# Patient Record
Sex: Male | Born: 1955 | Hispanic: Yes | Marital: Married | Smoking: Never smoker
Health system: Southern US, Community
[De-identification: ages and names within clinical notes are randomized; demographics above are authoritative.]

## PROBLEM LIST (undated history)

## (undated) HISTORY — PX: NO PAST SURGERIES: SHX2092

---

## 2017-11-10 ENCOUNTER — Ambulatory Visit: Payer: Self-pay | Admitting: Physician Assistant

## 2017-11-11 ENCOUNTER — Encounter: Payer: Worker's Compensation | Attending: Physician Assistant | Admitting: Physician Assistant

## 2017-11-11 DIAGNOSIS — T24732A Corrosion of third degree of left lower leg, initial encounter: Secondary | ICD-10-CM | POA: Diagnosis present

## 2017-11-11 DIAGNOSIS — T543X1A Toxic effect of corrosive alkalis and alkali-like substances, accidental (unintentional), initial encounter: Secondary | ICD-10-CM | POA: Insufficient documentation

## 2017-11-14 NOTE — Progress Notes (Signed)
ABDINASIR, SPADAFORE ([id number]) Visit Report for 11/11/2017 Abuse/Suicide Risk Screen Details Patient Name: [name], [name] Date of Service: 11/11/2017 8:00 AM Medical Record Number: [id number] Patient Account Number: [id number] Date of Birth/Sex: 02/03/1956 (62 y.o. M) Treating RN: Montey Hora Primary Care Morgyn Marut: PATIENT, NO Other Clinician: Referring Harriette Tovey: Sarajane Jews Treating Justine Cossin/Extender: STONE III, HOYT Weeks in Treatment: 0 Abuse/Suicide Risk Screen Items Answer ABUSE/SUICIDE RISK SCREEN: Has anyone close to you tried to hurt or harm you recentlyo No Do you feel uncomfortable with anyone in your familyo No Has anyone forced you do things that you didnot want to doo No Do you have any thoughts of harming yourselfo No Patient displays signs or symptoms of abuse and/or neglect. No Electronic Signature(s) Signed: 11/11/2017 3:57:25 PM By: Montey Hora Entered By: Montey Hora on 11/11/2017 01:60:10 [place]O POLICARPIO, Sol Blazing ([id number]) -------------------------------------------------------------------------------- Activities of Daily Living Details Patient Name: [name] Date of Service: 11/11/2017 8:00 AM Medical Record Number: [id number] Patient Account Number: [id number] Date of Birth/Sex: Jul 16, 1955 (62 y.o. M) Treating RN: Montey Hora Primary Care Cheryln Balcom: PATIENT, NO Other Clinician: Referring Raphaella Larkin: Sarajane Jews Treating Kepler Mccabe/Extender: STONE III, HOYT Weeks in Treatment: 0 Activities of Daily Living Items Answer Activities of Daily Living (Please select one for each item) Drive Automobile Completely Able Take Medications Completely Able Use Telephone Completely Able Care for Appearance Completely Able Use Toilet Completely Able Bath / Shower Completely Able Dress Self Completely Able Feed Self Completely Able Walk Completely Able Get In / Out Bed Completely Able Housework Completely  Able Prepare Meals Completely [place] for Self Completely Able Electronic Signature(s) Signed: 11/11/2017 3:57:25 PM By: Montey Hora Entered By: Montey Hora on 11/11/2017 23:76:28 CAPET[place]O Marlane Mingle ([id number]) -------------------------------------------------------------------------------- Education Assessment Details Patient Name: [name] Date of Service: 11/11/2017 8:00 AM Medical Record Number: [id number] Patient Account Number: [id number] Date of Birth/Sex: 1956-02-24 (62 y.o. M) Treating RN: Montey Hora Primary Care Joselinne Lawal: PATIENT, NO Other Clinician: Referring Hisao Doo: Sarajane Jews Treating Spring San/Extender: Melburn Hake, HOYT Weeks in Treatment: 0 Primary Learner Assessed: Patient Learning Preferences/Education Level/Primary Language Learning Preference: Explanation, Demonstration Highest Education Level: High School Preferred Language: Spanish Cognitive Barrier Assessment/Beliefs Language Barrier: Psychologist, occupational Needed: Yes Hospital Employed Language Interpreter Memory Deficit: No Emotional Barrier: No Cultural/Religious Beliefs Affecting Medical Care: No Physical Barrier Assessment Impaired Vision: No Impaired Hearing: No Decreased Hand dexterity: No Knowledge/Comprehension Assessment Knowledge Level: Medium Comprehension Level: Medium Ability to understand written Medium instructions: Ability to understand verbal Medium instructions: Motivation Assessment Anxiety Level: Calm Cooperation: Cooperative Education Importance: Acknowledges Need Interest in Health Problems: Asks Questions Perception: Coherent Willingness to Engage in Self- Medium Management Activities: Readiness to Engage in Self- Medium Management Activities: Electronic Signature(s) Signed: 11/11/2017 3:57:25 PM By: Montey Hora Entered By: Montey Hora on 11/11/2017 48:54:62 CAPET[place]O POLICARPIO, Sol Blazing  ([id number]) -------------------------------------------------------------------------------- Fall Risk Assessment Details Patient Name: [name] Date of Service: 11/11/2017 8:00 AM Medical Record Number: [id number] Patient Account Number: [id number] Date of Birth/Sex: 1955-11-23 (62 y.o. M) Treating RN: Montey Hora Primary Care Kailen Name: PATIENT, NO Other Clinician: Referring Andrey Hoobler: Sarajane Jews Treating Lyncoln Maskell/Extender: STONE III, HOYT Weeks in Treatment: 0 Fall Risk Assessment Items Have you had 2 or more falls in the last 12 monthso 0 No Have you had any fall that resulted in injury in the last 12 monthso 0 No FALL RISK ASSESSMENT: History of falling - immediate or within 3 months 0 No Secondary diagnosis 0 No Ambulatory aid None/bed rest/wheelchair/nurse 0 Yes Crutches/cane/walker  0 No Furniture 0 No IV Access/Saline Lock 0 No Gait/Training Normal/bed rest/immobile 0 Yes Weak 0 No Impaired 0 No Mental Status Oriented to own ability 0 Yes Electronic Signature(s) Signed: 11/11/2017 3:57:25 PM By: Montey Hora Entered By: Montey Hora on 11/11/2017 47:09:62 CAPET[place]O POLICARPIO, Sol Blazing ([id number]) -------------------------------------------------------------------------------- Foot Assessment Details Patient Name: [name] Date of Service: 11/11/2017 8:00 AM Medical Record Number: [id number] Patient Account Number: [id number] Date of Birth/Sex: 11-Oct-1955 (62 y.o. M) Treating RN: Montey Hora Primary Care Annalisse Minkoff: PATIENT, NO Other Clinician: Referring Simren Popson: Sarajane Jews Treating Nkenge Sonntag/Extender: STONE III, HOYT Weeks in Treatment: 0 Foot Assessment Items Site Locations + = Sensation present, - = Sensation absent, C = Callus, U = Ulcer R = Redness, W = Warmth, M = Maceration, PU = Pre-ulcerative lesion F = Fissure, S = Swelling, D = Dryness Assessment Right: Left: Other Deformity: No No Prior Foot Ulcer: No  No Prior Amputation: No No Charcot Joint: No No Ambulatory Status: Ambulatory Without Help Gait: Steady Electronic Signature(s) Signed: 11/11/2017 3:57:25 PM By: Montey Hora Entered By: Montey Hora on 11/11/2017 56:81:27 CAPET[place]O POLICARPIO, Sol Blazing ([id number]) -------------------------------------------------------------------------------- Nutrition Risk Assessment Details Patient Name: [name] Date of Service: 11/11/2017 8:00 AM Medical Record Number: [id number] Patient Account Number: [id number] Date of Birth/Sex: [birth year] (62 y.o. M) Treating RN: Montey Hora Primary Care Gerline Ratto: PATIENT, NO Other Clinician: Referring Kris No: Sarajane Jews Treating Malakhai Beitler/Extender: STONE III, HOYT Weeks in Treatment: 0 Height (in): Weight (lbs): Body Mass Index (BMI): Nutrition Risk Assessment Items NUTRITION RISK SCREEN: I have an illness or condition that made me change the kind and/or amount of 0 No food I eat I eat fewer than two meals per day 0 No I eat few fruits and vegetables, or milk products 0 No I have three or more drinks of beer, liquor or wine almost every day 0 No I have tooth or mouth problems that make it hard for me to eat 0 No I don't always have enough money to buy the food I need 0 No I eat alone most of the time 0 No I take three or more different prescribed or over-the-counter drugs a day 0 No Without wanting to, I have lost or gained 10 pounds in the last six months 0 No I am not always physically able to shop, cook and/or feed myself 0 No Nutrition Protocols Good Risk Protocol 0 No interventions needed Moderate Risk Protocol Electronic Signature(s) Signed: 11/11/2017 3:57:25 PM By: Montey Hora Entered By: Montey Hora on 11/11/2017 08:18:06

## 2017-11-15 NOTE — Progress Notes (Signed)
SEDERICK, Leonard ([id number]) Visit Report for 11/11/2017 Allergy List Details Patient Name: [name], [name] Date of Service: 11/11/2017 8:00 AM Medical Record Number: [id number] Patient Account Number: [id number] Date of Birth/Sex: [birth year] (62 y.o. M) Treating RN: Montey Hora Primary Care Meesha Sek: PATIENT, NO Other Clinician: Referring Satoru Milich: Sarajane Jews Treating Sebastiana Wuest/Extender: STONE III, HOYT Weeks in Treatment: 0 Allergies Active Allergies No Known Allergies Allergy Notes Electronic Signature(s) Signed: 11/11/2017 3:57:25 PM By: Montey Hora Entered By: Montey Hora on 11/11/2017 75:64:33 CAPETILLO POLICARPIO, Sol Blazing ([id number]) -------------------------------------------------------------------------------- Arrival Information Details Patient Name: [name] Date of Service: 11/11/2017 8:00 AM Medical Record Number: [id number] Patient Account Number: [id number] Date of Birth/Sex: Nov 21, 1955 (61 y.o. M) Treating RN: Montey Hora Primary Care Kirston Luty: PATIENT, NO Other Clinician: Referring Aundrey Elahi: Sarajane Jews Treating Bruchy Mikel/Extender: Melburn Hake, HOYT Weeks in Treatment: 0 Visit Information Patient Arrived: Ambulatory Arrival Time: 08:15 Accompanied By: translator Transfer Assistance: None Patient Identification Verified: Yes Secondary Verification Process Completed: Yes Electronic Signature(s) Signed: 11/11/2017 3:57:25 PM By: Montey Hora Entered By: Montey Hora on 11/11/2017 09:32:35 Denhoff, Sol Blazing ([id number]) -------------------------------------------------------------------------------- Clinic Level of Care Assessment Details Patient Name: [name] Date of Service: 11/11/2017 8:00 AM Medical Record Number: [id number] Patient Account Number: [id number] Date of Birth/Sex: Aug 31, 1955 (61 y.o. M) Treating RN: Ahmed Prima Primary Care Mariellen Blaney: PATIENT, NO Other  Clinician: Referring Natayla Cadenhead: Sarajane Jews Treating [place] Monds/Extender: STONE III, HOYT Weeks in Treatment: 0 Clinic Level of Care Assessment Items TOOL 2 Quantity Score X - Use when only an EandM is performed on the INITIAL visit 1 0 ASSESSMENTS - Nursing Assessment / Reassessment X - General Physical Exam (combine w/ comprehensive assessment (listed just below) when 1 20 performed on new pt. evals) X- 1 25 Comprehensive Assessment (HX, ROS, Risk Assessments, Wounds Hx, etc.) ASSESSMENTS - Wound and Skin Assessment / Reassessment X - Simple Wound Assessment / Reassessment - one wound 1 5 []  - 0 Complex Wound Assessment / Reassessment - multiple wounds []  - 0 Dermatologic / Skin Assessment (not related to wound area) ASSESSMENTS - Ostomy and/or Continence Assessment and Care []  - Incontinence Assessment and Management 0 []  - 0 Ostomy Care Assessment and Management (repouching, etc.) PROCESS - Coordination of Care X - Simple Patient / Family Education for ongoing care 1 15 []  - 0 Complex (extensive) Patient / Family Education for ongoing care []  - 0 Staff obtains Programmer, systems, Records, Test Results / Process Orders []  - 0 Staff telephones HHA, Nursing Homes / Clarify orders / etc []  - 0 Routine Transfer to another Facility (non-emergent condition) []  - 0 Routine Hospital Admission (non-emergent condition) []  - 0 New Admissions / Biomedical engineer / Ordering NPWT, Apligraf, etc. []  - 0 Emergency Hospital Admission (emergent condition) X- 1 10 Simple Discharge Coordination []  - 0 Complex (extensive) Discharge Coordination PROCESS - Special Needs []  - Pediatric / Minor Patient Management 0 []  - 0 Isolation Patient Management CAPETILLO POLICARPIO, Thor ([id number]) []  - 0 Hearing / Language / Visual special needs []  - 0 Assessment of Community assistance (transportation, D/C planning, etc.) []  - 0 Additional assistance / Altered mentation []  - 0 Support  Surface(s) Assessment (bed, cushion, seat, etc.) INTERVENTIONS - Wound Cleansing / Measurement X - Wound Imaging (photographs - any number of wounds) 1 5 []  - 0 Wound Tracing (instead of photographs) X- 1 5 Simple Wound Measurement - one wound []  - 0 Complex Wound Measurement - multiple wounds X- 1 5 Simple Wound Cleansing - one wound []  - 0 Complex  Wound Cleansing - multiple wounds INTERVENTIONS - Wound Dressings []  - Small Wound Dressing one or multiple wounds 0 X- 1 15 Medium Wound Dressing one or multiple wounds []  - 0 Large Wound Dressing one or multiple wounds []  - 0 Application of Medications - injection INTERVENTIONS - Miscellaneous []  - External ear exam 0 []  - 0 Specimen Collection (cultures, biopsies, blood, body fluids, etc.) []  - 0 Specimen(s) / Culture(s) sent or taken to Lab for analysis []  - 0 Patient Transfer (multiple staff / Harrel Lemon Lift / Similar devices) []  - 0 Simple Staple / Suture removal (25 or less) []  - 0 Complex Staple / Suture removal (26 or more) []  - 0 Hypo / Hyperglycemic Management (close monitor of Blood Glucose) X- 1 15 Ankle / Brachial Index (ABI) - do not check if billed separately Has the patient been seen at the hospital within the last three years: Yes Total Score: 120 Level Of Care: New/Established - Level 4 Electronic Signature(s) Signed: 11/12/2017 4:27:21 PM By: Alric Quan Entered By: Alric Quan on 11/11/2017 66:59:93 CAPETILLO POLICARPIO, Sol Blazing ([id number]) -------------------------------------------------------------------------------- Encounter Discharge Information Details Patient Name: [name] Date of Service: 11/11/2017 8:00 AM Medical Record Number: [id number] Patient Account Number: [id number] Date of Birth/Sex: 1955-09-08 (61 y.o. M) Treating RN: Ahmed Prima Primary Care Kenlynn Houde: PATIENT, NO Other Clinician: Referring Toia Micale: Sarajane Jews Treating Ajax Schroll/Extender: STONE III,  HOYT Weeks in Treatment: 0 Encounter Discharge Information Items Discharge Pain Level: 0 Discharge Condition: Stable Ambulatory Status: Ambulatory Discharge Destination: Home Private Transportation: Auto Accompanied By: interpreter Schedule Follow-up Appointment: Yes Medication Reconciliation completed and provided No to Patient/Care Ascencion Coye: Clinical Summary of Care: Electronic Signature(s) Signed: 11/12/2017 4:27:21 PM By: Alric Quan Entered By: Alric Quan on 11/11/2017 07:62:26 CAPETILLO POLICARPIO, Sol Blazing ([id number]) -------------------------------------------------------------------------------- Lower Extremity Assessment Details Patient Name: [name] Date of Service: 11/11/2017 8:00 AM Medical Record Number: [id number] Patient Account Number: [id number] Date of Birth/Sex: [birth year] (61 y.o. M) Treating RN: Montey Hora Primary Care Lorielle Boehning: PATIENT, NO Other Clinician: Referring Insiya Oshea: Sarajane Jews Treating Ghassan Coggeshall/Extender: STONE III, HOYT Weeks in Treatment: 0 Edema Assessment Assessed: [Left: No] [Right: No] Edema: [Left: N] [Right: o] Vascular Assessment Pulses: Dorsalis Pedis Palpable: [Left:Yes] Doppler Audible: [Left:Yes] Posterior Tibial Palpable: [Left:Yes] Doppler Audible: [Left:Yes] Extremity colors, hair growth, and conditions: Extremity Color: [Left:Normal] Hair Growth on Extremity: [Left:Yes] Temperature of Extremity: [Left:Warm] Capillary Refill: [Left:< 3 seconds] Blood Pressure: Brachial: [Left:122] Dorsalis Pedis: 148 [Left:Dorsalis Pedis:] Ankle: Posterior Tibial: 156 [Left:Posterior Tibial: 1.28] Toe Nail Assessment Left: Right: Thick: Yes Discolored: No Deformed: No Improper Length and Hygiene: No Electronic Signature(s) Signed: 11/11/2017 3:57:25 PM By: Montey Hora Entered By: Montey Hora on 11/11/2017 87:68:11 CAPETILLO POLICARPIO, Sol Blazing  ([id number]) -------------------------------------------------------------------------------- Multi Wound Chart Details Patient Name: [name] Date of Service: 11/11/2017 8:00 AM Medical Record Number: [id number] Patient Account Number: [id number] Date of Birth/Sex: 10/03/1955 (61 y.o. M) Treating RN: Ahmed Prima Primary Care Lexx Monte: PATIENT, NO Other Clinician: Referring Pinchus Weckwerth: Sarajane Jews Treating Lenus Trauger/Extender: STONE III, HOYT Weeks in Treatment: 0 Vital Signs Height(in): 69 Pulse(bpm): 28 Weight(lbs): 220 Blood Pressure(mmHg): 112/66 Body Mass Index(BMI): 32 Temperature(F): 98.1 Respiratory Rate 16 (breaths/min): Photos: [1:No Photos] [N/A:N/A] Wound Location: [1:Left Lower Leg - Lateral] [N/A:N/A] Wounding Event: [1:Chemical Burn] [N/A:N/A] Primary Etiology: [1:3rd degree Burn] [N/A:N/A] Date Acquired: [1:11/04/2017] [N/A:N/A] Weeks of Treatment: [1:0] [N/A:N/A] Wound Status: [1:Open] [N/A:N/A] Measurements L x W x D [1:3.5x8.2x0.1] [N/A:N/A] (cm) Area (cm) : [1:22.541] [N/A:N/A] Volume (cm) : [1:2.254] [N/A:N/A] Classification: [1:Full Thickness Without Exposed Support Structures] [  N/A:N/A] Exudate Amount: [1:Medium] [N/A:N/A] Exudate Type: [1:Serous] [N/A:N/A] Exudate Color: [1:amber] [N/A:N/A] Wound Margin: [1:Flat and Intact] [N/A:N/A] Granulation Amount: [1:None Present (0%)] [N/A:N/A] Necrotic Amount: [1:Large (67-100%)] [N/A:N/A] Necrotic Tissue: [1:Eschar, Adherent Slough] [N/A:N/A] Exposed Structures: [1:Fat Layer (Subcutaneous Tissue) Exposed: Yes Fascia: No Tendon: No Muscle: No Joint: No Bone: No] [N/A:N/A] Epithelialization: [1:None] [N/A:N/A] Periwound Skin Texture: [1:Excoriation: No Induration: No Callus: No Crepitus: No Rash: No Scarring: No] [N/A:N/A] Periwound Skin Moisture: [1:Maceration: No Dry/Scaly: No] [N/A:N/A] Periwound Skin Color: [N/A:N/A] Erythema: Yes Atrophie Blanche: No Cyanosis: No Ecchymosis:  No Hemosiderin Staining: No Mottled: No Pallor: No Rubor: No Erythema Location: Circumferential N/A N/A Temperature: No Abnormality N/A N/A Tenderness on Palpation: Yes N/A N/A Wound Preparation: Ulcer Cleansing: N/A N/A Rinsed/Irrigated with Saline Topical Anesthetic Applied: Other: lidocaine 4% Treatment Notes Electronic Signature(s) Signed: 11/12/2017 4:27:21 PM By: Alric Quan Entered By: Alric Quan on 11/11/2017 19:14:78 [facility], Sol Blazing ([id number]) -------------------------------------------------------------------------------- [place] Details Patient Name: [name] Date of Service: 11/11/2017 8:00 AM Medical Record Number: [id number] Patient Account Number: [id number] Date of Birth/Sex: 1956-04-20 (61 y.o. M) Treating RN: Ahmed Prima Primary Care Karie Skowron: PATIENT, NO Other Clinician: Referring Keshanna Riso: Sarajane Jews Treating Casin Federici/Extender: STONE III, HOYT Weeks in Treatment: 0 Active Inactive ` Orientation to the Wound Care Program Nursing Diagnoses: Knowledge deficit related to the wound healing center program Goals: Patient/caregiver will verbalize understanding of the [place] Program Date Initiated: 11/11/2017 Target Resolution Date: 11/22/2017 Goal Status: Active Interventions: Provide education on orientation to the wound center Notes: ` Pain, Acute or Chronic Nursing Diagnoses: Pain, acute or chronic: actual or potential Potential alteration in comfort, pain Goals: Patient/caregiver will verbalize adequate pain control between visits Date Initiated: 11/11/2017 Target Resolution Date: 02/21/2018 Goal Status: Active Interventions: Complete pain assessment as per visit requirements Notes: ` Wound/Skin Impairment Nursing Diagnoses: Impaired tissue integrity Knowledge deficit related to ulceration/compromised skin integrity Goals: Ulcer/skin breakdown will have a volume  reduction of 80% by week 12 Date Initiated: 11/11/2017 Target Resolution Date: 01/10/2018 Goal Status: Active CAPETILLO KIPLING, GRASER ([id number]) Interventions: Assess patient/caregiver ability to perform ulcer/skin care regimen upon admission and as needed Assess ulceration(s) every visit Notes: Electronic Signature(s) Signed: 11/12/2017 4:27:21 PM By: Alric Quan Entered By: Alric Quan on 11/11/2017 40:10:27 CAPETILLO POLICARPIO, [place] ([id number]) -------------------------------------------------------------------------------- Pain Assessment Details Patient Name: [name] Date of Service: 11/11/2017 8:00 AM Medical Record Number: [id number] Patient Account Number: [id number] Date of Birth/Sex: August 20, 1955 (61 y.o. M) Treating RN: Montey Hora Primary Care Valda Christenson: PATIENT, NO Other Clinician: Referring Nicodemus Denk: Sarajane Jews Treating Desree Leap/Extender: STONE III, HOYT Weeks in Treatment: 0 Active Problems Location of Pain Severity and Description of Pain Patient Has Paino Yes Site Locations Pain Location: Pain in Ulcers With Dressing Change: Yes Duration of the Pain. Constant / Intermittento Intermittent Character of Pain Describe the Pain: Other: itches and is uncomfortable Pain Management and Medication Current Pain Management: Electronic Signature(s) Signed: 11/11/2017 3:57:25 PM By: Montey Hora Entered By: Montey Hora on 11/11/2017 87:56:43 Dola, Walnut ([id number]) -------------------------------------------------------------------------------- Patient/Caregiver Education Details Patient Name: [name] Date of Service: 11/11/2017 8:00 AM Medical Record Number: [id number] Patient Account Number: [id number] Date of Birth/Gender: 1956/05/05 (61 y.o. M) Treating RN: Ahmed Prima Primary Care Physician: PATIENT, NO Other Clinician: Referring Physician: Sarajane Jews Treating Physician/Extender:  Melburn Hake, HOYT Weeks in Treatment: 0 Education Assessment Education Provided To: Patient Education Topics Provided Wound/Skin Impairment: Handouts: Caring for Your Ulcer Methods: Explain/Verbal Responses: State content correctly Electronic Signature(s) Signed: 11/12/2017 4:27:21  PM By: Alric Quan Entered By: Alric Quan on 11/11/2017 27:78:24 [place], Sol Blazing ([id number]) -------------------------------------------------------------------------------- Wound Assessment Details Patient Name: [name] Date of Service: 11/11/2017 8:00 AM Medical Record Number: [id number] Patient Account Number: [id number] Date of Birth/Sex: May 30, 1956 (61 y.o. M) Treating RN: Montey Hora Primary Care Donevan Biller: PATIENT, NO Other Clinician: Referring Lether Tesch: Sarajane Jews Treating Riyanna Crutchley/Extender: STONE III, HOYT Weeks in Treatment: 0 Wound Status Wound Number: 1 Primary Etiology: 3rd degree Burn Wound Location: Left Lower Leg - Lateral Wound Status: Open Wounding Event: Chemical Burn Date Acquired: 11/04/2017 Weeks Of Treatment: 0 Clustered Wound: No Photos Photo Uploaded By: Montey Hora on 11/11/2017 08:50:14 Wound Measurements Length: (cm) 3.5 Width: (cm) 8.2 Depth: (cm) 0.1 Area: (cm) 22.541 Volume: (cm) 2.254 % Reduction in Area: % Reduction in Volume: Epithelialization: None Tunneling: No Undermining: No Wound Description Full Thickness Without Exposed Support Foul O Classification: Structures Slough Wound Margin: Flat and Intact Exudate Medium Amount: Exudate Type: Serous Exudate Color: amber dor After Cleansing: No /Fibrino Yes Wound Bed Granulation Amount: None Present (0%) Exposed Structure Necrotic Amount: Large (67-100%) Fascia Exposed: No Necrotic Quality: Eschar, Adherent Slough Fat Layer (Subcutaneous Tissue) Exposed: Yes Tendon Exposed: No Muscle Exposed: No Joint Exposed: No Bone Exposed: No CAPETILLO  POLICARPIO, [place] ([id number]) Periwound Skin Texture Texture Color No Abnormalities Noted: No No Abnormalities Noted: No Callus: No Atrophie Blanche: No Crepitus: No Cyanosis: No Excoriation: No Ecchymosis: No Induration: No Erythema: Yes Rash: No Erythema Location: Circumferential Scarring: No Hemosiderin Staining: No Mottled: No Moisture Pallor: No No Abnormalities Noted: No Rubor: No Dry / Scaly: No Maceration: No Temperature / Pain Temperature: No Abnormality Tenderness on Palpation: Yes Wound Preparation Ulcer Cleansing: Rinsed/Irrigated with Saline Topical Anesthetic Applied: Other: lidocaine 4%, Treatment Notes Wound #1 (Left, Lateral Lower Leg) 1. Cleansed with: Clean wound with Normal Saline 3. Peri-wound Care: Skin Prep 4. Dressing Applied: Santyl Ointment 5. Secondary Dressing Applied Dry [place] Notes wet gauze with saline apply dry gauze Electronic Signature(s) Signed: 11/11/2017 3:57:25 PM By: Montey Hora Entered By: Montey Hora on 11/11/2017 12:45:80 CAPETILLO POLICARPIO, Sol Blazing ([id number]) -------------------------------------------------------------------------------- Vitals Details Patient Name: [name] Date of Service: 11/11/2017 8:00 AM Medical Record Number: [id number] Patient Account Number: [id number] Date of Birth/Sex: 11/28/1955 (61 y.o. M) Treating RN: Montey Hora Primary Care Johnika Escareno: PATIENT, NO Other Clinician: Referring Toluwani Ruder: Sarajane Jews Treating [place]/Extender: STONE III, HOYT Weeks in Treatment: 0 Vital Signs Time Taken: 08:22 Temperature (F): 98.1 Height (in): 69 Pulse (bpm): 66 Source: Measured Respiratory Rate (breaths/min): 16 Weight (lbs): 220 Blood Pressure (mmHg): 112/66 Source: Measured Reference Range: 80 - 120 mg / dl Body Mass Index (BMI): 32.5 Electronic Signature(s) Signed: 11/11/2017 3:57:25 PM By: Montey Hora Entered By: Montey Hora on 11/11/2017  08:23:57
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
## 2017-11-18 ENCOUNTER — Encounter: Payer: Worker's Compensation | Attending: Physician Assistant | Admitting: Physician Assistant

## 2017-11-18 DIAGNOSIS — Y9269 Other specified industrial and construction area as the place of occurrence of the external cause: Secondary | ICD-10-CM | POA: Insufficient documentation

## 2017-11-18 DIAGNOSIS — T543X1A Toxic effect of corrosive alkalis and alkali-like substances, accidental (unintentional), initial encounter: Secondary | ICD-10-CM | POA: Diagnosis not present

## 2017-11-18 DIAGNOSIS — T24702A Corrosion of third degree of unspecified site of left lower limb, except ankle and foot, initial encounter: Secondary | ICD-10-CM | POA: Insufficient documentation
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
## 2017-11-25 ENCOUNTER — Encounter: Payer: Worker's Compensation | Admitting: Physician Assistant

## 2017-11-25 DIAGNOSIS — T24702A Corrosion of third degree of unspecified site of left lower limb, except ankle and foot, initial encounter: Secondary | ICD-10-CM | POA: Diagnosis not present
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
## 2017-11-27 NOTE — Progress Notes (Signed)
SONAM, HUELSMANN ([id number]) Visit Report for 11/25/2017 Arrival Information Details Patient Name: [name], [name] Date of Service: 11/25/2017 9:30 AM Medical Record Number: [id number] Patient Account Number: [id number] Date of Birth/Sex: [birth year] (62 y.o. M) Treating RN: Cornell Barman Primary Care Alon Mazor: PATIENT, NO Other Clinician: Referring Anayiah Howden: Sarajane Jews Treating Lateisha Thurlow/Extender: Melburn Hake, HOYT Weeks in Treatment: 2 Visit Information History Since Last Visit Added or deleted any medications: No Patient Arrived: Ambulatory Any new allergies or adverse reactions: No Arrival Time: 09:43 Had a fall or experienced change in No Accompanied By: wife activities of daily living that may affect Transfer Assistance: None risk of falls: Patient Identification Verified: Yes Signs or symptoms of abuse/neglect since last visito No Secondary Verification Process Completed: Yes Hospitalized since last visit: No Implantable device outside of the clinic excluding No cellular tissue based products placed in the center since last visit: Has Dressing in Place as Prescribed: Yes Pain Present Now: No Electronic Signature(s) Signed: 11/25/2017 5:51:43 PM By: Gretta Cool, BSN, RN, CWS, Kim RN, BSN Entered By: Gretta Cool, BSN, RN, CWS, Kim on 11/25/2017 40:34:74 [name] ([id number]) -------------------------------------------------------------------------------- Clinic Level of Care Assessment Details Patient Name: [name] Date of Service: 11/25/2017 9:30 AM Medical Record Number: [id number] Patient Account Number: [id number] Date of Birth/Sex: [birth year] (61 y.o. M) Treating RN: Ahmed Prima Primary Care Yahya Boldman: PATIENT, NO Other Clinician: Referring Arryanna Holquin: Sarajane Jews Treating Amberlee Garvey/Extender: STONE III, HOYT Weeks in Treatment: 2 Clinic Level of Care Assessment Items TOOL 4 Quantity Score X - Use when only an EandM is  performed on FOLLOW-UP visit 1 0 ASSESSMENTS - Nursing Assessment / Reassessment X - Reassessment of Co-morbidities (includes updates in patient status) 1 10 X- 1 5 Reassessment of Adherence to Treatment Plan ASSESSMENTS - Wound and Skin Assessment / Reassessment X - Simple Wound Assessment / Reassessment - one wound 1 5 []  - 0 Complex Wound Assessment / Reassessment - multiple wounds []  - 0 Dermatologic / Skin Assessment (not related to wound area) ASSESSMENTS - Focused Assessment []  - Circumferential Edema Measurements - multi extremities 0 []  - 0 Nutritional Assessment / Counseling / Intervention []  - 0 Lower Extremity Assessment (monofilament, tuning fork, pulses) []  - 0 Peripheral Arterial Disease Assessment (using hand held doppler) ASSESSMENTS - Ostomy and/or Continence Assessment and Care []  - Incontinence Assessment and Management 0 []  - 0 Ostomy Care Assessment and Management (repouching, etc.) PROCESS - Coordination of Care X - Simple Patient / Family Education for ongoing care 1 15 []  - 0 Complex (extensive) Patient / Family Education for ongoing care []  - 0 Staff obtains Programmer, systems, Records, Test Results / Process Orders []  - 0 Staff telephones HHA, Nursing Homes / Clarify orders / etc []  - 0 Routine Transfer to another Facility (non-emergent condition) []  - 0 Routine Hospital Admission (non-emergent condition) []  - 0 New Admissions / Biomedical engineer / Ordering NPWT, Apligraf, etc. []  - 0 Emergency Hospital Admission (emergent condition) X- 1 10 Simple Discharge Coordination [name], [name] ([id number]) []  - 0 Complex (extensive) Discharge Coordination PROCESS - Special Needs []  - Pediatric / Minor Patient Management 0 []  - 0 Isolation Patient Management []  - 0 Hearing / Language / Visual special needs []  - 0 Assessment of Community assistance (transportation, D/C planning, etc.) []  - 0 Additional assistance / Altered mentation []   - 0 Support Surface(s) Assessment (bed, cushion, seat, etc.) INTERVENTIONS - Wound Cleansing / Measurement X - Simple Wound Cleansing - one wound 1 5 []  - 0 Complex Wound  Cleansing - multiple wounds X- 1 5 Wound Imaging (photographs - any number of wounds) []  - 0 Wound Tracing (instead of photographs) X- 1 5 Simple Wound Measurement - one wound []  - 0 Complex Wound Measurement - multiple wounds INTERVENTIONS - Wound Dressings []  - Small Wound Dressing one or multiple wounds 0 X- 1 15 Medium Wound Dressing one or multiple wounds []  - 0 Large Wound Dressing one or multiple wounds []  - 0 Application of Medications - topical []  - 0 Application of Medications - injection INTERVENTIONS - Miscellaneous []  - External ear exam 0 []  - 0 Specimen Collection (cultures, biopsies, blood, body fluids, etc.) []  - 0 Specimen(s) / Culture(s) sent or taken to Lab for analysis []  - 0 Patient Transfer (multiple staff / Civil Service fast streamer / Similar devices) []  - 0 Simple Staple / Suture removal (25 or less) []  - 0 Complex Staple / Suture removal (26 or more) []  - 0 Hypo / Hyperglycemic Management (close monitor of Blood Glucose) []  - 0 Ankle / Brachial Index (ABI) - do not check if billed separately X- 1 5 Vital Signs [name], [name] ([id number]) Has the patient been seen at the hospital within the last three years: Yes Total Score: 80 Level Of Care: New/Established - Level 3 Electronic Signature(s) Signed: 11/26/2017 4:20:38 PM By: Alric Quan Entered By: Alric Quan on 11/25/2017 40:98:11 [place], [name] ([id number]) -------------------------------------------------------------------------------- Encounter Discharge Information Details Patient Name: [name] Date of Service: 11/25/2017 9:30 AM Medical Record Number: [id number] Patient Account Number: [id number] Date of Birth/Sex: 09/16/1955 (61 y.o. M) Treating RN: Ahmed Prima Primary  Care Danell Vazquez: PATIENT, NO Other Clinician: Referring Krisalyn Yankowski: Sarajane Jews Treating Jaidynn Balster/Extender: Melburn Hake, HOYT Weeks in Treatment: 2 Encounter Discharge Information Items Discharge Condition: Stable Ambulatory Status: Ambulatory Discharge Destination: Home Transportation: Private Auto Accompanied By: family, interpretor Schedule Follow-up Appointment: Yes Clinical Summary of Care: Electronic Signature(s) Signed: 11/26/2017 4:20:38 PM By: Alric Quan Entered By: Alric Quan on 11/25/2017 46:96:29 [name], [name] ([id number]) -------------------------------------------------------------------------------- Lower Extremity Assessment Details Patient Name: [name] Date of Service: 11/25/2017 9:30 AM Medical Record Number: [id number] Patient Account Number: [id number] Date of Birth/Sex: 02/11/1956 (61 y.o. M) Treating RN: Cornell Barman Primary Care Berman Grainger: PATIENT, NO Other Clinician: Referring Pesach Frisch: Sarajane Jews Treating Alisse Tuite/Extender: STONE III, HOYT Weeks in Treatment: 2 Vascular Assessment Pulses: Dorsalis Pedis Palpable: [Left:Yes] Posterior Tibial Extremity colors, hair growth, and conditions: Extremity Color: [Left:Normal] Hair Growth on Extremity: [Left:Yes] Temperature of Extremity: [Left:Warm] Capillary Refill: [Left:< 3 seconds] Toe Nail Assessment Left: Right: Thick: No Discolored: No Deformed: No Improper Length and Hygiene: No Electronic Signature(s) Signed: 11/25/2017 5:51:43 PM By: Gretta Cool, BSN, RN, CWS, Kim RN, BSN Entered By: Gretta Cool, BSN, RN, CWS, Kim on 11/25/2017 64:40:34 [name], [name] ([id number]) -------------------------------------------------------------------------------- Multi Wound Chart Details Patient Name: [name] Date of Service: 11/25/2017 9:30 AM Medical Record Number: [id number] Patient Account Number: [id number] Date of Birth/Sex: [birth year] (61 y.o.  M) Treating RN: Ahmed Prima Primary Care Kazuma Elena: PATIENT, NO Other Clinician: Referring Aashritha Miedema: Sarajane Jews Treating Shernita Rabinovich/Extender: STONE III, HOYT Weeks in Treatment: 2 Vital Signs Height(in): 69 Pulse(bpm): 51 Weight(lbs): 220 Blood Pressure(mmHg): 113/59 Body Mass Index(BMI): 32 Temperature(F): 97.9 Respiratory Rate 16 (breaths/min): Photos: [1:No Photos] [N/A:N/A] Wound Location: [1:Left Lower Leg - Lateral] [N/A:N/A] Wounding Event: [1:Chemical Burn] [N/A:N/A] Primary Etiology: [1:3rd degree Burn] [N/A:N/A] Date Acquired: [1:11/04/2017] [N/A:N/A] Weeks of Treatment: [1:2] [N/A:N/A] Wound Status: [1:Open] [N/A:N/A] Measurements L x W x D [1:3.4x7x0.1] [N/A:N/A] (cm) Area (cm) : [1:18.692] [  N/A:N/A] Volume (cm) : [1:1.869] [N/A:N/A] % Reduction in Area: [1:17.10%] [N/A:N/A] % Reduction in Volume: [1:17.10%] [N/A:N/A] Classification: [1:Full Thickness Without Exposed Support Structures] [N/A:N/A] Exudate Amount: [1:Medium] [N/A:N/A] Exudate Type: [1:Serous] [N/A:N/A] Exudate Color: [1:amber] [N/A:N/A] Wound Margin: [1:Flat and Intact] [N/A:N/A] Granulation Amount: [1:Medium (34-66%)] [N/A:N/A] Granulation Quality: [1:Red] [N/A:N/A] Necrotic Amount: [1:Medium (34-66%)] [N/A:N/A] Exposed Structures: [1:Fat Layer (Subcutaneous Tissue) Exposed: Yes Fascia: No Tendon: No Muscle: No Joint: No Bone: No] [N/A:N/A] Epithelialization: [1:Small (1-33%)] [N/A:N/A] Periwound Skin Texture: [1:Excoriation: No Induration: No Callus: No Crepitus: No Rash: No Scarring: No] [N/A:N/A] Periwound Skin Moisture: [N/A:N/A] Maceration: No Dry/Scaly: No Periwound Skin Color: Ecchymosis: Yes N/A N/A Atrophie Blanche: No Cyanosis: No Erythema: No Hemosiderin Staining: No Mottled: No Pallor: No Rubor: No Temperature: No Abnormality N/A N/A Tenderness on Palpation: Yes N/A N/A Wound Preparation: Ulcer Cleansing: N/A N/A Rinsed/Irrigated with Saline Topical Anesthetic  Applied: Other: lidocaine 4% Treatment Notes Electronic Signature(s) Signed: 11/26/2017 4:20:38 PM By: Alric Quan Entered By: Alric Quan on 11/25/2017 08:65:78 [name] ([id number]) -------------------------------------------------------------------------------- [place] Details Patient Name: [name] Date of Service: 11/25/2017 9:30 AM Medical Record Number: [id number] Patient Account Number: [id number] Date of Birth/Sex: 24-Apr-1956 (61 y.o. M) Treating RN: Ahmed Prima Primary Care Rhyanna Sorce: PATIENT, NO Other Clinician: Referring Laurier Jasperson: Sarajane Jews Treating Darci Lykins/Extender: STONE III, HOYT Weeks in Treatment: 2 Active Inactive ` Orientation to the Wound Care Program Nursing Diagnoses: Knowledge deficit related to the wound healing center program Goals: Patient/caregiver will verbalize understanding of the [place] Program Date Initiated: 11/11/2017 Target Resolution Date: 11/22/2017 Goal Status: Active Interventions: Provide education on orientation to the wound center Notes: ` Pain, Acute or Chronic Nursing Diagnoses: Pain, acute or chronic: actual or potential Potential alteration in comfort, pain Goals: Patient/caregiver will verbalize adequate pain control between visits Date Initiated: 11/11/2017 Target Resolution Date: 02/21/2018 Goal Status: Active Interventions: Complete pain assessment as per visit requirements Notes: ` Wound/Skin Impairment Nursing Diagnoses: Impaired tissue integrity Knowledge deficit related to ulceration/compromised skin integrity Goals: Ulcer/skin breakdown will have a volume reduction of 80% by week 12 Date Initiated: 11/11/2017 Target Resolution Date: 01/10/2018 Goal Status: Active Johnny XAVIOUS, [name] ([id number]) Interventions: Assess patient/caregiver ability to perform ulcer/skin care regimen upon admission and as needed Assess  ulceration(s) every visit Notes: Electronic Signature(s) Signed: 11/26/2017 4:20:38 PM By: Alric Quan Entered By: Alric Quan on 11/25/2017 03:47:42 [name], [name] ([id number]) -------------------------------------------------------------------------------- Pain Assessment Details Patient Name: [name] Date of Service: 11/25/2017 9:30 AM Medical Record Number: [id number] Patient Account Number: [id number] Date of Birth/Sex: 1956-07-07 (61 y.o. M) Treating RN: Cornell Barman Primary Care Ferrin Liebig: PATIENT, NO Other Clinician: Referring Clarnce Homan: Sarajane Jews Treating Anaira Seay/Extender: Melburn Hake, HOYT Weeks in Treatment: 2 Active Problems Location of Pain Severity and Description of Pain Patient Has Paino No Site Locations Pain Management and Medication Current Pain Management: Goals for Pain Management Topical or injectable lidocaine is offered to patient for acute pain when surgical debridement is performed. If needed, Patient is instructed to use over the counter pain medication for the following 24-48 hours after debridement. Wound care MDs do not prescribed pain medications. Patient has chronic pain or uncontrolled pain. Patient has been instructed to make an appointment with their Primary Care Physician for pain management. Electronic Signature(s) Signed: 11/25/2017 5:51:43 PM By: Gretta Cool, BSN, RN, CWS, Kim RN, BSN Entered By: Gretta Cool, BSN, RN, CWS, Kim on 11/25/2017 41:66:06 Johnny KIJUAN, GALLICCHIO ([id number]) -------------------------------------------------------------------------------- Patient/Caregiver Education Details Patient Name: Rebeca Alert, [name] Date of Service: 11/25/2017  9:30 AM Medical Record Number: [id number] Patient Account Number: [id number] Date of Birth/Gender: Feb 24, 1956 (62 y.o. M) Treating RN: Ahmed Prima Primary Care Physician: PATIENT, NO Other Clinician: Referring Physician: Sarajane Jews Treating Physician/Extender: Melburn Hake, HOYT Weeks in Treatment: 2 Education Assessment Education Provided To: Patient Education Topics Provided Wound/Skin Impairment: Handouts: Caring for Your Ulcer, Other: change dressing as ordered Methods: Demonstration, Explain/Verbal Responses: State content correctly Electronic Signature(s) Signed: 11/26/2017 4:20:38 PM By: Alric Quan Entered By: Alric Quan on 11/25/2017 46:27:03 [name], [name] ([id number]) -------------------------------------------------------------------------------- Wound Assessment Details Patient Name: [name] Date of Service: 11/25/2017 9:30 AM Medical Record Number: [id number] Patient Account Number: [id number] Date of Birth/Sex: 10/01/1955 (61 y.o. M) Treating RN: Cornell Barman Primary Care Sundance Moise: PATIENT, NO Other Clinician: Referring Jaylin Roundy: Sarajane Jews Treating Emsley Custer/Extender: STONE III, HOYT Weeks in Treatment: 2 Wound Status Wound Number: 1 Primary Etiology: 3rd degree Burn Wound Location: Left Lower Leg - Lateral Wound Status: Open Wounding Event: Chemical Burn Date Acquired: 11/04/2017 Weeks Of Treatment: 2 Clustered Wound: No Photos Photo Uploaded By: Gretta Cool, BSN, RN, CWS, Kim on 11/25/2017 18:07:44 Wound Measurements Length: (cm) 3.4 Width: (cm) 7 Depth: (cm) 0.1 Area: (cm) 18.692 Volume: (cm) 1.869 % Reduction in Area: 17.1% % Reduction in Volume: 17.1% Epithelialization: Small (1-33%) Tunneling: No Undermining: No Wound Description Full Thickness Without Exposed Support Foul [place]do Classification: Structures Slough/F Wound Margin: Flat and Intact Exudate Medium Amount: Exudate Type: Serous Exudate Color: amber r After Cleansing: No ibrino Yes Wound Bed Granulation Amount: Medium (34-66%) Exposed Structure Granulation Quality: Red Fascia Exposed: No Necrotic Amount: Medium (34-66%) Fat Layer (Subcutaneous Tissue) Exposed:  Yes Necrotic Quality: Adherent Slough Tendon Exposed: No Muscle Exposed: No Joint Exposed: No Bone Exposed: No [name], Jolly ([id number]) Periwound Skin Texture Texture Color No Abnormalities Noted: No No Abnormalities Noted: No Callus: No Atrophie Blanche: No Crepitus: No Cyanosis: No Excoriation: No Ecchymosis: Yes Induration: No Erythema: No Rash: No Hemosiderin Staining: No Scarring: No Mottled: No Pallor: No Moisture Rubor: No No Abnormalities Noted: No Dry / Scaly: No Temperature / Pain Maceration: No Temperature: No Abnormality Tenderness on Palpation: Yes Wound Preparation Ulcer Cleansing: Rinsed/Irrigated with Saline Topical Anesthetic Applied: Other: lidocaine 4%, Treatment Notes Wound #1 (Left, Lateral Lower Leg) 1. Cleansed with: Clean wound with Normal Saline 2. Anesthetic Topical Lidocaine 4% cream to wound bed prior to debridement 4. Dressing Applied: Xeroform 5. Secondary [place] Notes ace wrap Electronic Signature(s) Signed: 11/25/2017 5:51:43 PM By: Gretta Cool, BSN, RN, CWS, Kim RN, BSN Entered By: Gretta Cool, BSN, RN, CWS, Kim on 11/25/2017 10:25:85 Johnny YANN, BIEHN ([id number]) -------------------------------------------------------------------------------- Vitals Details Patient Name: [name] Date of Service: 11/25/2017 9:30 AM Medical Record Number: [id number] Patient Account Number: [id number] Date of Birth/Sex: May 15, 1956 (61 y.o. M) Treating RN: Cornell Barman Primary Care Alvita Fana: PATIENT, NO Other Clinician: Referring Naelle Diegel: Sarajane Jews Treating Jenkins Risdon/Extender: STONE III, HOYT Weeks in Treatment: 2 Vital Signs Time Taken: 09:44 Temperature (F): 97.9 Height (in): 69 Pulse (bpm): 58 Weight (lbs): 220 Respiratory Rate (breaths/min): 16 Body Mass Index (BMI): 32.5 Blood Pressure (mmHg): 113/59 Reference Range: 80 - 120 mg / dl Electronic Signature(s) Signed:  11/25/2017 5:51:43 PM By: Gretta Cool, BSN, RN, CWS, Kim RN, BSN Entered By: Gretta Cool, BSN, RN, CWS, Kim on 11/25/2017 09:44:40

## 2017-12-02 ENCOUNTER — Encounter: Payer: Worker's Compensation | Admitting: Physician Assistant

## 2017-12-02 DIAGNOSIS — T24702A Corrosion of third degree of unspecified site of left lower limb, except ankle and foot, initial encounter: Secondary | ICD-10-CM | POA: Diagnosis not present
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
## 2017-12-09 ENCOUNTER — Encounter: Payer: Worker's Compensation | Admitting: Physician Assistant

## 2017-12-09 DIAGNOSIS — T24702A Corrosion of third degree of unspecified site of left lower limb, except ankle and foot, initial encounter: Secondary | ICD-10-CM | POA: Diagnosis not present

## 2017-12-14 NOTE — Progress Notes (Signed)
JONATHANDAVID, MARLETT ([id number]) Visit Report for 12/09/2017 Arrival Information Details Patient Name: [name], [name] Date of Service: 12/09/2017 4:00 PM Medical Record Number: [id number] Patient Account Number: [id number] Date of Birth/Sex: 1956/05/04 (62 y.o. M) Treating RN: Cornell Barman Primary Care Hassan Blackshire: PATIENT, NO Other Clinician: Referring [place] Jon: Sarajane Jews Treating Jaidon Ellery/Extender: Melburn Hake, HOYT Weeks in Treatment: 4 Visit Information History Since Last Visit Added or deleted any medications: No Patient Arrived: Ambulatory Had a fall or experienced change in No Arrival Time: 16:01 activities of daily living that may affect Accompanied By: intepreter risk of falls: Transfer Assistance: None Signs or symptoms of abuse/neglect since last visito No Patient Identification Verified: Yes Hospitalized since last visit: No Secondary Verification Process Completed: Yes Has Dressing in Place as Prescribed: Yes Patient Requires Transmission-Based No Pain Present Now: No Precautions: Patient Has Alerts: No Electronic Signature(s) Signed: 12/09/2017 5:37:32 PM By: Gretta Cool, BSN, RN, CWS, Kim RN, BSN Entered By: Gretta Cool, BSN, RN, CWS, Kim on 12/09/2017 55:97:41 CAPETILLO Marlane Mingle ([id number]) -------------------------------------------------------------------------------- Clinic Level of Care Assessment Details Patient Name: [name] Date of Service: 12/09/2017 4:00 PM Medical Record Number: [id number] Patient Account Number: [id number] Date of Birth/Sex: 03-06-1956 (61 y.o. M) Treating RN: Ahmed Prima Primary Care Tejas Seawood: PATIENT, NO Other Clinician: Referring Adah Stoneberg: Sarajane Jews Treating Izear Pine/Extender: STONE III, HOYT Weeks in Treatment: 4 Clinic Level of Care Assessment Items TOOL 4 Quantity Score X - Use when only an EandM is performed on FOLLOW-UP visit 1 0 ASSESSMENTS - Nursing Assessment / Reassessment X -  Reassessment of Co-morbidities (includes updates in patient status) 1 10 X- 1 5 Reassessment of Adherence to Treatment Plan ASSESSMENTS - Wound and Skin Assessment / Reassessment X - Simple Wound Assessment / Reassessment - one wound 1 5 []  - 0 Complex Wound Assessment / Reassessment - multiple wounds []  - 0 Dermatologic / Skin Assessment (not related to wound area) ASSESSMENTS - Focused Assessment []  - Circumferential Edema Measurements - multi extremities 0 []  - 0 Nutritional Assessment / Counseling / Intervention []  - 0 Lower Extremity Assessment (monofilament, tuning fork, pulses) []  - 0 Peripheral Arterial Disease Assessment (using hand held doppler) ASSESSMENTS - Ostomy and/or Continence Assessment and Care []  - Incontinence Assessment and Management 0 []  - 0 Ostomy Care Assessment and Management (repouching, etc.) PROCESS - Coordination of Care X - Simple Patient / Family Education for ongoing care 1 15 []  - 0 Complex (extensive) Patient / Family Education for ongoing care []  - 0 Staff obtains Programmer, systems, Records, Test Results / Process Orders []  - 0 Staff telephones HHA, Nursing Homes / Clarify orders / etc []  - 0 Routine Transfer to another Facility (non-emergent condition) []  - 0 Routine Hospital Admission (non-emergent condition) []  - 0 New Admissions / Biomedical engineer / Ordering NPWT, Apligraf, etc. []  - 0 Emergency Hospital Admission (emergent condition) X- 1 10 Simple Discharge Coordination CAPETILLO POLICARPIO, Hadden ([id number]) []  - 0 Complex (extensive) Discharge Coordination PROCESS - Special Needs []  - Pediatric / Minor Patient Management 0 []  - 0 Isolation Patient Management []  - 0 Hearing / Language / Visual special needs []  - 0 Assessment of Community assistance (transportation, D/C planning, etc.) []  - 0 Additional assistance / Altered mentation []  - 0 Support Surface(s) Assessment (bed, cushion, seat, etc.) INTERVENTIONS - Wound  Cleansing / Measurement X - Simple Wound Cleansing - one wound 1 5 []  - 0 Complex Wound Cleansing - multiple wounds X- 1 5 Wound Imaging (photographs - any number of wounds) []  -  0 Wound Tracing (instead of photographs) X- 1 5 Simple Wound Measurement - one wound []  - 0 Complex Wound Measurement - multiple wounds INTERVENTIONS - Wound Dressings X - Small Wound Dressing one or multiple wounds 1 10 []  - 0 Medium Wound Dressing one or multiple wounds []  - 0 Large Wound Dressing one or multiple wounds X- 1 5 Application of Medications - topical []  - 0 Application of Medications - injection INTERVENTIONS - Miscellaneous []  - External ear exam 0 []  - 0 Specimen Collection (cultures, biopsies, blood, body fluids, etc.) []  - 0 Specimen(s) / Culture(s) sent or taken to Lab for analysis []  - 0 Patient Transfer (multiple staff / Civil Service fast streamer / Similar devices) []  - 0 Simple Staple / Suture removal (25 or less) []  - 0 Complex Staple / Suture removal (26 or more) []  - 0 Hypo / Hyperglycemic Management (close monitor of Blood Glucose) []  - 0 Ankle / Brachial Index (ABI) - do not check if billed separately X- 1 5 Vital Signs CAPETILLO POLICARPIO, Eban ([id number]) Has the patient been seen at the hospital within the last three years: Yes Total Score: 80 Level Of Care: New/Established - Level 3 Electronic Signature(s) Signed: 12/12/2017 5:38:20 PM By: Alric Quan Entered By: Alric Quan on 12/10/2017 89:21:19 [place], Sol Blazing ([id number]) -------------------------------------------------------------------------------- Encounter Discharge Information Details Patient Name: [name] Date of Service: 12/09/2017 4:00 PM Medical Record Number: [id number] Patient Account Number: [id number] Date of Birth/Sex: 1956-03-02 (61 y.o. M) Treating RN: Roger Shelter Primary Care Keinan Brouillet: PATIENT, NO Other Clinician: Referring Mildred Bollard: Sarajane Jews Treating Lowella Kindley/Extender: Melburn Hake, HOYT Weeks in Treatment: 4 Encounter Discharge Information Items Discharge Condition: Stable Ambulatory Status: Ambulatory Discharge Destination: Home Transportation: Private Auto Schedule Follow-up Appointment: Yes Clinical Summary of Care: Electronic Signature(s) Signed: 12/09/2017 5:16:34 PM By: Roger Shelter Entered By: Roger Shelter on 12/09/2017 70:26:37 [place], Sol Blazing ([id number]) -------------------------------------------------------------------------------- Lower Extremity Assessment Details Patient Name: [name] Date of Service: 12/09/2017 4:00 PM Medical Record Number: [id number] Patient Account Number: [id number] Date of Birth/Sex: [birth year] (61 y.o. M) Treating RN: Cornell Barman Primary Care Shaundra Fullam: PATIENT, NO Other Clinician: Referring Dimples Probus: Sarajane Jews Treating Ladarrius Bogdanski/Extender: STONE III, HOYT Weeks in Treatment: 4 Vascular Assessment Pulses: Dorsalis Pedis Palpable: [Left:Yes] Posterior Tibial Palpable: [Left:Yes] Extremity colors, hair growth, and conditions: Extremity Color: [Left:Hyperpigmented] Hair Growth on Extremity: [Left:Yes] Temperature of Extremity: [Left:Warm] Capillary Refill: [Left:< 3 seconds] Toe Nail Assessment Left: Right: Thick: No Discolored: No Deformed: No Improper Length and Hygiene: No Electronic Signature(s) Signed: 12/09/2017 5:37:32 PM By: Gretta Cool, BSN, RN, CWS, Kim RN, BSN Entered By: Gretta Cool, BSN, RN, CWS, Kim on 12/09/2017 72:09:47 [place] King, Trevin ([id number]) -------------------------------------------------------------------------------- Multi Wound Chart Details Patient Name: [name] Date of Service: 12/09/2017 4:00 PM Medical Record Number: [id number] Patient Account Number: [id number] Date of Birth/Sex: 20-May-1956 (61 y.o. M) Treating RN: Ahmed Prima Primary Care Sherion Dooly: PATIENT, NO Other  Clinician: Referring [place] Vandergriff: Sarajane Jews Treating Latunya Kissick/Extender: STONE III, HOYT Weeks in Treatment: 4 Vital Signs Height(in): 69 Pulse(bpm): 29 Weight(lbs): 220 Blood Pressure(mmHg): 138/82 Body Mass Index(BMI): 32 Temperature(F): 98.1 Respiratory Rate 16 (breaths/min): Photos: [1:No Photos] [N/A:N/A] Wound Location: [1:Left Lower Leg - Lateral] [N/A:N/A] Wounding Event: [1:Chemical Burn] [N/A:N/A] Primary Etiology: [1:3rd degree Burn] [N/A:N/A] Date Acquired: [1:11/04/2017] [N/A:N/A] Weeks of Treatment: [1:4] [N/A:N/A] Wound Status: [1:Open] [N/A:N/A] Measurements L x W x D [1:1.3x4.5x0.1] [N/A:N/A] (cm) Area (cm) : [1:4.595] [N/A:N/A] Volume (cm) : [1:0.459] [N/A:N/A] % Reduction in Area: [1:79.60%] [N/A:N/A] % Reduction in Volume: [1:79.60%] [N/A:N/A]  Classification: [1:Full Thickness Without Exposed Support Structures] [N/A:N/A] Exudate Amount: [1:Medium] [N/A:N/A] Exudate Type: [1:Serous] [N/A:N/A] Exudate Color: [1:amber] [N/A:N/A] Wound Margin: [1:Flat and Intact] [N/A:N/A] Granulation Amount: [1:Large (67-100%)] [N/A:N/A] Granulation Quality: [1:Red, Pink] [N/A:N/A] Necrotic Amount: [1:Small (1-33%)] [N/A:N/A] Exposed Structures: [1:Fat Layer (Subcutaneous Tissue) Exposed: Yes Fascia: No Tendon: No Muscle: No Joint: No Bone: No] [N/A:N/A] Epithelialization: [1:Medium (34-66%)] [N/A:N/A] Periwound Skin Texture: [1:Scarring: Yes Excoriation: No Induration: No Callus: No Crepitus: No Rash: No] [N/A:N/A] Periwound Skin Moisture: [N/A:N/A] Maceration: No Dry/Scaly: No Periwound Skin Color: Hemosiderin Staining: Yes N/A N/A Atrophie Blanche: No Cyanosis: No Ecchymosis: No Erythema: No Mottled: No Pallor: No Rubor: No Temperature: No Abnormality N/A N/A Tenderness on Palpation: Yes N/A N/A Wound Preparation: Ulcer Cleansing: N/A N/A Rinsed/Irrigated with Saline Topical Anesthetic Applied: Other: lidocaine 4% Treatment Notes Electronic  Signature(s) Signed: 12/12/2017 5:38:20 PM By: Alric Quan Entered By: Alric Quan on 12/09/2017 38:75:64 CAPETILLO RONTE, PARKER ([id number]) -------------------------------------------------------------------------------- Multi-Disciplinary Care Plan Details Patient Name: [name] Date of Service: 12/09/2017 4:00 PM Medical Record Number: [id number] Patient Account Number: [id number] Date of Birth/Sex: 1955/07/29 (61 y.o. M) Treating RN: Ahmed Prima Primary Care Nickayla Mcinnis: PATIENT, NO Other Clinician: Referring Naethan Bracewell: Sarajane Jews Treating Dajanique Robley/Extender: STONE III, HOYT Weeks in Treatment: 4 Active Inactive ` Orientation to the Wound Care Program Nursing Diagnoses: Knowledge deficit related to the wound healing center program Goals: Patient/caregiver will verbalize understanding of the [facility] Date Initiated: 11/11/2017 Target Resolution Date: 11/22/2017 Goal Status: Active Interventions: Provide education on orientation to the wound center Notes: ` Pain, Acute or Chronic Nursing Diagnoses: Pain, acute or chronic: actual or potential Potential alteration in comfort, pain Goals: Patient/caregiver will verbalize adequate pain control between visits Date Initiated: 11/11/2017 Target Resolution Date: 02/21/2018 Goal Status: Active Interventions: Complete pain assessment as per visit requirements Notes: ` Wound/Skin Impairment Nursing Diagnoses: Impaired tissue integrity Knowledge deficit related to ulceration/compromised skin integrity Goals: Ulcer/skin breakdown will have a volume reduction of 80% by week 12 Date Initiated: 11/11/2017 Target Resolution Date: 01/10/2018 Goal Status: Active CAPETILLO MANCE, VALLEJO ([id number]) Interventions: Assess patient/caregiver ability to perform ulcer/skin care regimen upon admission and as needed Assess ulceration(s) every visit Notes: Electronic  Signature(s) Signed: 12/12/2017 5:38:20 PM By: Alric Quan Entered By: Alric Quan on 12/09/2017 73:22:02 [place], Sol Blazing ([id number]) -------------------------------------------------------------------------------- Pain Assessment Details Patient Name: [name] Date of Service: 12/09/2017 4:00 PM Medical Record Number: [id number] Patient Account Number: [id number] Date of Birth/Sex: [birth year] (61 y.o. M) Treating RN: Cornell Barman Primary Care Cordon Gassett: PATIENT, NO Other Clinician: Referring Harvey Lingo: Sarajane Jews Treating Ambert Virrueta/Extender: Melburn Hake, HOYT Weeks in Treatment: 4 Active Problems Location of Pain Severity and Description of Pain Patient Has Paino No Site Locations With Dressing Change: No Pain Management and Medication Current Pain Management: Electronic Signature(s) Signed: 12/09/2017 5:37:32 PM By: Gretta Cool, BSN, RN, CWS, Kim RN, BSN Entered By: Gretta Cool, BSN, RN, CWS, Kim on 12/09/2017 16:07:37 CAPETILLO KENYAN, KARNES ([id number]) -------------------------------------------------------------------------------- Patient/Caregiver Education Details Patient Name: [name] Date of Service: 12/09/2017 4:00 PM Medical Record Number: [id number] Patient Account Number: [id number] Date of Birth/Gender: March 08, 1956 (61 y.o. M) Treating RN: Roger Shelter Primary Care Physician: PATIENT, NO Other Clinician: Referring Physician: Sarajane Jews Treating Physician/Extender: Melburn Hake, HOYT Weeks in Treatment: 4 Education Assessment Education Provided To: Patient Education Topics Provided Wound Debridement: Handouts: Wound Debridement Methods: Explain/Verbal Wound/Skin Impairment: Handouts: Caring for Your Ulcer Methods: Explain/Verbal Responses: State content correctly Electronic Signature(s) Signed: 12/09/2017 5:16:34 PM By: Roger Shelter Entered By: Roger Shelter  on 12/09/2017 44:96:75 [place],  Johnte ([id number]) -------------------------------------------------------------------------------- Wound Assessment Details Patient Name: DAMIANO, STAMPER Date of Service: 12/09/2017 4:00 PM Medical Record Number: [id number] Patient Account Number: [id number] Date of Birth/Sex: 1956-06-18 (62 y.o. M) Treating RN: Cornell Barman Primary Care Avrianna Smart: PATIENT, NO Other Clinician: Referring Roman Dubuc: Sarajane Jews Treating Raziah Funnell/Extender: STONE III, HOYT Weeks in Treatment: 4 Wound Status Wound Number: 1 Primary Etiology: 3rd degree Burn Wound Location: Left Lower Leg - Lateral Wound Status: Open Wounding Event: Chemical Burn Date Acquired: 11/04/2017 Weeks Of Treatment: 4 Clustered Wound: No Photos Photo Uploaded By: Gretta Cool, BSN, RN, CWS, Kim on 12/11/2017 08:17:46 Wound Measurements Length: (cm) 1.3 Width: (cm) 4.5 Depth: (cm) 0.1 Area: (cm) 4.595 Volume: (cm) 0.459 % Reduction in Area: 79.6% % Reduction in Volume: 79.6% Epithelialization: Medium (34-66%) Tunneling: No Undermining: No Wound Description Full Thickness Without Exposed Support Foul Odo Classification: Structures Slough/F Wound Margin: Flat and Intact Exudate Medium Amount: Exudate Type: Serous Exudate Color: amber r After Cleansing: No ibrino Yes Wound Bed Granulation Amount: Large (67-100%) Exposed Structure Granulation Quality: Red, Pink Fascia Exposed: No Necrotic Amount: Small (1-33%) Fat Layer (Subcutaneous Tissue) Exposed: Yes Necrotic Quality: Adherent Slough Tendon Exposed: No Muscle Exposed: No Joint Exposed: No Bone Exposed: No CAPETILLO POLICARPIO, Feras ([id number]) Periwound Skin Texture Texture Color No Abnormalities Noted: No No Abnormalities Noted: No Callus: No Atrophie Blanche: No Crepitus: No Cyanosis: No Excoriation: No Ecchymosis: No Induration: No Erythema: No Rash: No Hemosiderin Staining: Yes Scarring: Yes Mottled: No Pallor: No Moisture Rubor:  No No Abnormalities Noted: No Dry / Scaly: No Temperature / Pain Maceration: No Temperature: No Abnormality Tenderness on Palpation: Yes Wound Preparation Ulcer Cleansing: Rinsed/Irrigated with Saline Topical Anesthetic Applied: Other: lidocaine 4%, Treatment Notes Wound #1 (Left, Lateral Lower Leg) 1. Cleansed with: Clean wound with Normal Saline 2. Anesthetic Topical Lidocaine 4% cream to wound bed prior to debridement 4. Dressing Applied: Prisma Ag 5. Secondary Dressing Applied Non-Adherent pad Notes ace wrap Electronic Signature(s) Signed: 12/09/2017 5:37:32 PM By: Gretta Cool, BSN, RN, CWS, Kim RN, BSN Entered By: Gretta Cool, BSN, RN, CWS, Kim on 12/09/2017 33:00:76 [place], Redbird Smith ([id number]) -------------------------------------------------------------------------------- Vitals Details Patient Name: [name] Date of Service: 12/09/2017 4:00 PM Medical Record Number: [id number] Patient Account Number: [id number] Date of Birth/Sex: 31-Jan-1956 (61 y.o. M) Treating RN: Cornell Barman Primary Care Nitish Roes: PATIENT, NO Other Clinician: Referring Shayde Gervacio: Sarajane Jews Treating Voyd Groft/Extender: STONE III, HOYT Weeks in Treatment: 4 Vital Signs Time Taken: 16:03 Temperature (F): 98.1 Height (in): 69 Pulse (bpm): 68 Weight (lbs): 220 Respiratory Rate (breaths/min): 16 Body Mass Index (BMI): 32.5 Blood Pressure (mmHg): 138/82 Reference Range: 80 - 120 mg / dl Electronic Signature(s) Signed: 12/09/2017 5:37:32 PM By: Gretta Cool, BSN, RN, CWS, Kim RN, BSN Entered By: Gretta Cool, BSN, RN, CWS, Kim on 12/09/2017 16:06:07

## 2017-12-14 NOTE — Progress Notes (Signed)
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 Previous Signature: 12/10/2017 8:29:14 AM Version By: Worthy Keeler PA-C Entered By: Alric Quan on 12/10/2017 14:45:29

## 2017-12-16 ENCOUNTER — Encounter: Payer: Worker's Compensation | Attending: Physician Assistant | Admitting: Physician Assistant

## 2017-12-16 DIAGNOSIS — T543X1A Toxic effect of corrosive alkalis and alkali-like substances, accidental (unintentional), initial encounter: Secondary | ICD-10-CM | POA: Diagnosis not present

## 2017-12-16 DIAGNOSIS — X58XXXA Exposure to other specified factors, initial encounter: Secondary | ICD-10-CM | POA: Diagnosis not present

## 2017-12-16 DIAGNOSIS — T24302A Burn of third degree of unspecified site of left lower limb, except ankle and foot, initial encounter: Secondary | ICD-10-CM | POA: Insufficient documentation
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
## 2017-12-23 ENCOUNTER — Encounter: Payer: PRIVATE HEALTH INSURANCE | Admitting: Physician Assistant

## 2017-12-23 ENCOUNTER — Encounter: Payer: Worker's Compensation | Admitting: Physician Assistant

## 2017-12-23 DIAGNOSIS — T24302A Burn of third degree of unspecified site of left lower limb, except ankle and foot, initial encounter: Secondary | ICD-10-CM | POA: Diagnosis not present
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
## 2017-12-30 ENCOUNTER — Encounter: Payer: Worker's Compensation | Admitting: Physician Assistant

## 2017-12-30 DIAGNOSIS — T24302A Burn of third degree of unspecified site of left lower limb, except ankle and foot, initial encounter: Secondary | ICD-10-CM | POA: Diagnosis not present
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
## 2021-09-11 ENCOUNTER — Telehealth: Payer: Self-pay

## 2021-09-11 ENCOUNTER — Other Ambulatory Visit: Payer: Self-pay

## 2021-09-11 NOTE — Telephone Encounter (Signed)
CALLED PATIENT NO ANSWER LEFT VOICEMAIL FOR A CALL BACK [phone]

## 2021-09-12 ENCOUNTER — Telehealth: Payer: Self-pay

## 2021-09-12 NOTE — Telephone Encounter (Signed)
CALLED PATIENT NO ANSWER LEFT VOICEMAIL FOR A CALL BACK ?[phone] ?

## 2021-09-25 ENCOUNTER — Telehealth: Payer: Self-pay

## 2021-09-25 ENCOUNTER — Telehealth: Payer: Self-pay | Admitting: Gastroenterology

## 2021-09-25 NOTE — Telephone Encounter (Signed)
Schedule colonoscopy, needs interpretor  ?

## 2021-09-25 NOTE — Telephone Encounter (Signed)
Called patient he is at work we spoke with wife we let he know we need him to call and we do answer the phones in [place] but will call back with interp  she stated he will try and call again but he might also just come here in person to schedule appointment  today I used interp id [id number] ?

## 2021-11-05 ENCOUNTER — Ambulatory Visit
Admission: RE | Admit: 2021-11-05 | Discharge: 2021-11-05 | Disposition: A | Discharge status: Home / Self Care | Payer: BC Managed Care – PPO | Attending: Physician Assistant | Admitting: Physician Assistant

## 2021-11-05 ENCOUNTER — Other Ambulatory Visit: Payer: Self-pay | Admitting: Physician Assistant

## 2021-11-05 ENCOUNTER — Ambulatory Visit
Admission: RE | Admit: 2021-11-05 | Discharge: 2021-11-05 | Disposition: A | Discharge status: Home / Self Care | Payer: BC Managed Care – PPO | Source: Ambulatory Visit | Attending: Physician Assistant | Admitting: Physician Assistant

## 2021-11-05 ENCOUNTER — Other Ambulatory Visit: Payer: Self-pay

## 2021-11-05 DIAGNOSIS — M948X8 Other specified disorders of cartilage, other site: Secondary | ICD-10-CM

## 2021-11-05 DIAGNOSIS — Z1211 Encounter for screening for malignant neoplasm of colon: Secondary | ICD-10-CM

## 2021-11-05 MED ORDER — PEG 3350-KCL-NA BICARB-NACL 420 G PO SOLR: ORAL | 0 refills | Status: AC

## 2021-11-08 ENCOUNTER — Other Ambulatory Visit: Payer: Self-pay | Admitting: Physician Assistant

## 2021-11-08 DIAGNOSIS — M948X8 Other specified disorders of cartilage, other site: Secondary | ICD-10-CM

## 2021-11-16 ENCOUNTER — Ambulatory Visit
Admission: RE | Admit: 2021-11-16 | Discharge: 2021-11-16 | Disposition: A | Discharge status: Home / Self Care | Payer: BC Managed Care – PPO | Source: Ambulatory Visit | Attending: Gastroenterology | Admitting: Gastroenterology

## 2021-11-16 ENCOUNTER — Encounter: Payer: Self-pay | Admitting: Gastroenterology

## 2021-11-16 ENCOUNTER — Ambulatory Visit: Payer: BC Managed Care – PPO | Admitting: Certified Registered"

## 2021-11-16 ENCOUNTER — Encounter
Admission: RE | Disposition: A | Discharge status: Home / Self Care | Payer: Self-pay | Source: Ambulatory Visit | Attending: Gastroenterology

## 2021-11-16 DIAGNOSIS — K635 Polyp of colon: Secondary | ICD-10-CM | POA: Diagnosis not present

## 2021-11-16 DIAGNOSIS — D122 Benign neoplasm of ascending colon: Secondary | ICD-10-CM | POA: Insufficient documentation

## 2021-11-16 DIAGNOSIS — D124 Benign neoplasm of descending colon: Secondary | ICD-10-CM | POA: Diagnosis not present

## 2021-11-16 DIAGNOSIS — Z1211 Encounter for screening for malignant neoplasm of colon: Secondary | ICD-10-CM | POA: Insufficient documentation

## 2021-11-16 HISTORY — PX: COLONOSCOPY WITH PROPOFOL: SHX5780

## 2021-11-16 SURGERY — COLONOSCOPY WITH PROPOFOL
Anesthesia: General

## 2021-11-16 MED ORDER — PROPOFOL 10 MG/ML IV BOLUS
INTRAVENOUS | Status: DC | PRN
  Administered 2021-11-16: 20 mg via INTRAVENOUS
  Administered 2021-11-16: 50 mg via INTRAVENOUS

## 2021-11-16 MED ORDER — SIMETHICONE 40 MG/0.6ML PO SUSP
ORAL | Status: DC | PRN
  Administered 2021-11-16: 120 mL

## 2021-11-16 MED ORDER — PROPOFOL 500 MG/50ML IV EMUL
INTRAVENOUS | Status: DC | PRN
  Administered 2021-11-16: 155 ug/kg/min via INTRAVENOUS

## 2021-11-16 MED ORDER — SODIUM CHLORIDE 0.9 % IV SOLN: INTRAVENOUS | Status: DC

## 2021-11-16 MED ORDER — LIDOCAINE HCL (CARDIAC) PF 100 MG/5ML IV SOSY
PREFILLED_SYRINGE | INTRAVENOUS | Status: DC | PRN
  Administered 2021-11-16: 100 mg via INTRAVENOUS

## 2021-11-16 NOTE — Anesthesia Preprocedure Evaluation (Signed)
Anesthesia Evaluation  ?Patient identified by MRN, date of birth, ID band ?Patient awake ? ? ? ?Reviewed: ?Allergy & Precautions, NPO status , Patient's Chart, lab work & pertinent test results ? ?History of Anesthesia Complications ?Negative for: history of anesthetic complications ? ?Airway ?Mallampati: III ? ?TM Distance: >3 FB ?Neck ROM: full ? ? ? Dental ? ?(+) Upper Dentures, Lower Dentures ?  ?Pulmonary ?neg pulmonary ROS, neg shortness of breath,  ?  ?Pulmonary exam normal ? ? ? ? ? ? ? Cardiovascular ?(-) angina(-) Past MI negative cardio ROS ?Normal cardiovascular exam ? ? ?  ?Neuro/Psych ?negative neurological ROS ? negative psych ROS  ? GI/Hepatic ?negative GI ROS, Neg liver ROS, neg GERD  ,  ?Endo/Other  ?negative endocrine ROS ? Renal/GU ?negative Renal ROS  ?negative genitourinary ?  ?Musculoskeletal ? ? Abdominal ?  ?Peds ? Hematology ?negative hematology ROS ?(+)   ?Anesthesia Other Findings ?No past medical history on file. ? ? ? ?BMI   ? Body Mass Index: 30.27 kg/m?  ?  ? ? Reproductive/Obstetrics ?negative OB ROS ? ?  ? ? ? ? ? ? ? ? ? ? ? ? ? ?  ?  ? ? ? ? ? ? ? ? ?Anesthesia Physical ?Anesthesia Plan ? ?ASA: 2 ? ?Anesthesia Plan: General  ? ?Post-op Pain Management:   ? ?Induction: Intravenous ? ?PONV Risk Score and Plan: Propofol infusion and TIVA ? ?Airway Management Planned: Natural Airway and Nasal Cannula ? ?Additional Equipment:  ? ?Intra-op Plan:  ? ?Post-operative Plan:  ? ?Informed Consent: I have reviewed the patients History and Physical, chart, labs and discussed the procedure including the risks, benefits and alternatives for the proposed anesthesia with the patient or authorized representative who has indicated his/her understanding and acceptance.  ? ? ? ?Dental Advisory Given and Interpreter used for interveiw ? ?Plan Discussed with: Anesthesiologist, CRNA and Surgeon ? ?Anesthesia Plan Comments: (Patient consented for risks of anesthesia  including but not limited to:  ?- adverse reactions to medications ?- risk of airway placement if required ?- damage to eyes, teeth, lips or other oral mucosa ?- nerve damage due to positioning  ?- sore throat or hoarseness ?- Damage to heart, brain, nerves, lungs, other parts of body or loss of life ? ?Patient voiced understanding.)  ? ? ? ? ? ? ?Anesthesia Quick Evaluation ? ?

## 2021-11-16 NOTE — Anesthesia Postprocedure Evaluation (Signed)
Anesthesia Post Note ? ?Patient: [name] ? ?Procedure(s) Performed: COLONOSCOPY WITH PROPOFOL ? ?Patient location during evaluation: Endoscopy ?Anesthesia Type: General ?Level of consciousness: awake and alert ?Pain management: pain level controlled ?Vital Signs Assessment: post-procedure vital signs reviewed and stable ?Respiratory status: spontaneous breathing, nonlabored ventilation, respiratory function stable and patient connected to nasal cannula oxygen ?Cardiovascular status: blood pressure returned to baseline and stable ?Postop Assessment: no apparent nausea or vomiting ?Anesthetic complications: no ? ? ?No notable events documented. ? ? ?Last Vitals:  ?Vitals:  ? 11/16/21 0850 11/16/21 0910  ?BP: 123/79 123/79  ?Pulse: (!) 42   ?Resp: 14   ?Temp:    ?SpO2: 100%   ?  ?Last Pain:  ?Vitals:  ? 11/16/21 0830  ?TempSrc: Temporal  ? ? ?  ?  ?  ?  ?  ?  ? ?Precious Haws Mihran Lebarron ? ? ? ? ?

## 2021-11-16 NOTE — Transfer of Care (Signed)
Immediate Anesthesia Transfer of Care Note ? ?Patient: [name] ? ?Procedure(s) Performed: COLONOSCOPY WITH PROPOFOL ? ?Patient Location: Endoscopy Unit ? ?Anesthesia Type:General ? ?Level of Consciousness: awake, drowsy and patient cooperative ? ?Airway & Oxygen Therapy: Patient Spontanous Breathing and Patient connected to face mask oxygen ? ?Post-op Assessment: Report given to RN and Post -op Vital signs reviewed and stable ? ?Post vital signs: Reviewed and stable ? ?Last Vitals:  ?Vitals Value Taken Time  ?BP    ?Temp    ?Pulse 51 11/16/21 0835  ?Resp 12 11/16/21 0835  ?SpO2 100 % 11/16/21 0835  ?Vitals shown include unvalidated device data. ? ?Last Pain:  ?Vitals:  ? 11/16/21 0744  ?TempSrc: Temporal  ?   ? ?  ? ?Complications: No notable events documented. ?

## 2021-11-16 NOTE — Op Note (Signed)
[facility] ?Gastroenterology ?Patient Name: [name] ?Procedure Date: 11/16/2021 8:06 AM ?MRN: [id number] ?Account #: [id number] ?Date of Birth: 1955/09/14 ?Admit Type: Outpatient ?Age: 66 ?Room: [facility] ENDO ROOM 4 ?Gender: Male ?Note Status: Finalized ?Instrument Name: Colonoscope [id number] ?Procedure:             Colonoscopy ?Indications:           Screening for colorectal malignant neoplasm ?Providers:             Jonathon Bellows MD, MD ?Referring MD:          Forest Gleason Md, MD (Referring MD) ?Medicines:             Monitored Anesthesia Care ?Complications:         No immediate complications. ?Procedure:             Pre-Anesthesia Assessment: ?                       - Prior to the procedure, a History and Physical was  ?                       performed, and patient medications, allergies and  ?                       sensitivities were reviewed. The patient's tolerance  ?                       of previous anesthesia was reviewed. ?                       - The risks and benefits of the procedure and the  ?                       sedation options and risks were discussed with the  ?                       patient. All questions were answered and informed  ?                       consent was obtained. ?                       - ASA Grade Assessment: II - A patient with mild  ?                       systemic disease. ?                       After obtaining informed consent, the colonoscope was  ?                       passed under direct vision. Throughout the procedure,  ?                       the patient's blood pressure, pulse, and oxygen  ?                       saturations were monitored continuously. The  ?                       Colonoscope  was introduced through the anus and  ?                       advanced to the the cecum, identified by the  ?                       appendiceal orifice. The colonoscopy was performed  ?                       with ease. The patient tolerated the procedure  well.  ?                       The quality of the bowel preparation was good. ?Findings: ?     The perianal and digital rectal examinations were normal. ?     Two sessile polyps were found in the ascending colon and cecum. The  ?     polyps were 3 to 4 mm in size. These polyps were removed with a jumbo  ?     cold forceps. Resection and retrieval were complete. ?     Two sessile polyps were found in the ascending colon and cecum. The  ?     polyps were 4 to 6 mm in size. These polyps were removed with a cold  ?     snare. Resection and retrieval were complete. ?     A 3 mm polyp was found in the descending colon. The polyp was sessile.  ?     The polyp was removed with a jumbo cold forceps. Resection and retrieval  ?     were complete. ?     A 5 mm polyp was found in the descending colon. The polyp was sessile.  ?     The polyp was removed with a cold snare. Resection and retrieval were  ?     complete. ?     The exam was otherwise without abnormality. ?     The exam was otherwise without abnormality on direct and retroflexion  ?     views. ?Impression:            - Two 3 to 4 mm polyps in the ascending colon and in  ?                       the cecum, removed with a jumbo cold forceps. Resected  ?                       and retrieved. ?                       - Two 4 to 6 mm polyps in the ascending colon and in  ?                       the cecum, removed with a cold snare. Resected and  ?                       retrieved. ?                       - One 3 mm polyp in the descending colon, removed with  ?  a jumbo cold forceps. Resected and retrieved. ?                       - One 5 mm polyp in the descending colon, removed with  ?                       a cold snare. Resected and retrieved. ?                       - The examination was otherwise normal. ?                       - The examination was otherwise normal on direct and  ?                       retroflexion views. ?Recommendation:        -  Discharge patient to home (with escort). ?                       - Resume previous diet. ?                       - Continue present medications. ?                       - Await pathology results. ?                       - Repeat colonoscopy in 3 years for surveillance. ?Procedure Code(s):     --- Professional --- ?                       [phone], Colonoscopy, flexible; with removal of  ?                       tumor(s), polyp(s), or other lesion(s) by snare  ?                       technique ?                       [id number], 59, Colonoscopy, flexible; with biopsy, single  ?                       or multiple ?Diagnosis Code(s):     --- Professional --- ?                       K63.5, Polyp of colon ?                       Z12.11, Encounter for screening for malignant neoplasm  ?                       of colon ?CPT copyright 2019 American Medical Association. All rights reserved. ?The codes documented in this report are preliminary and upon coder review may  ?be revised to meet current compliance requirements. ?Jonathon Bellows, MD ?Jonathon Bellows MD, MD ?11/16/2021 8:34:31 AM ?This report has been signed electronically. ?Number of Addenda: 0 ?Note Initiated On: 11/16/2021 8:06 AM ?Scope Withdrawal Time: 0 hours 17 minutes 17 seconds  ?Total Procedure Duration: 0 hours 19  minutes 52 seconds  ?Estimated Blood Loss:  Estimated blood loss: none. ?     [facility] ?

## 2021-11-16 NOTE — Anesthesia Procedure Notes (Signed)
Procedure Name: General with mask airway ?Date/Time: 11/16/2021 8:13 AM ?Performed by: Kelton Pillar, CRNA ?Pre-anesthesia Checklist: Patient identified, Emergency Drugs available, Suction available and Patient being monitored ?Patient Re-evaluated:Patient Re-evaluated prior to induction ?Oxygen Delivery Method: Simple face mask ?Induction Type: IV induction ?Placement Confirmation: positive ETCO2, CO2 detector and breath sounds checked- equal and bilateral ?Dental Injury: Teeth and Oropharynx as per pre-operative assessment  ? ? ? ? ?

## 2021-11-16 NOTE — H&P (Signed)
? ? ? ?  Jonathon Bellows, MD ?[address], [place], [place], [place], [address] ?[address] [place], [place], [place], [address] ?Phone: [phone]  ?Fax: [phone] ? ?Primary Care Physician:  [facility] ? ? ?Pre-Procedure History & Physical: ?HPI:  [name] is a 67 y.o. male is here for an colonoscopy. ?  ?No past medical history on file. ? ? ? ?Prior to Admission medications   ?Medication Sig Start Date End Date Taking? Authorizing Provider  ?polyethylene glycol-electrolytes (NULYTELY) 420 g solution Prepare according to package instructions. Starting at 5:00 PM: Drink one 8 oz glass of mixture every 15 minutes until you finish half of the jug. Five hours prior to procedure, drink 8 oz glass of mixture every 15 minutes until it is all gone. Make sure you do not drink anything 4 hours prior to your procedure. 11/05/21   Jonathon Bellows, MD  ? ? ?Allergies as of 11/05/2021  ? (Not on File)  ? ? ?No family history on file. ? ?Social History  ? ?Socioeconomic History  ? Marital status: Married  ?  Spouse name: Not on file  ? Number of children: Not on file  ? Years of education: Not on file  ? Highest education level: Not on file  ?Occupational History  ? Not on file  ?Tobacco Use  ? Smoking status: Never  ? Smokeless tobacco: Never  ?Vaping Use  ? Vaping Use: Never used  ?Substance and Sexual Activity  ? Alcohol use: Never  ? Drug use: Never  ? Sexual activity: Not on file  ?Other Topics Concern  ? Not on file  ?Social History Narrative  ? Not on file  ? ?Social Determinants of Health  ? ?Financial Resource Strain: Not on file  ?Food Insecurity: Not on file  ?Transportation Needs: Not on file  ?Physical Activity: Not on file  ?Stress: Not on file  ?Social Connections: Not on file  ?Intimate Partner Violence: Not on file  ? ? ?Review of Systems: ?See HPI, otherwise negative ROS ? ?Physical Exam: ?BP 111/71   Pulse (!) 48   Temp (!) 96.7 ?F (35.9 ?C) (Temporal)   Resp 16    Ht '5\' 9"'$  (1.753 m)   Wt 93 kg   SpO2 98%   BMI 30.27 kg/m?  ?General:   Alert,  pleasant and cooperative in NAD ?Head:  Normocephalic and atraumatic. ?Neck:  Supple; no masses or thyromegaly. ?Lungs:  Clear throughout to auscultation, normal respiratory effort.    ?Heart:  +S1, +S2, Regular rate and rhythm, No edema. ?Abdomen:  Soft, nontender and nondistended. Normal bowel sounds, without guarding, and without rebound.   ?Neurologic:  Alert and  oriented x4;  grossly normal neurologically. ? ?Impression/Plan: ?[name] is here for an colonoscopy to be performed for Screening colonoscopy average risk   ?Risks, benefits, limitations, and alternatives regarding  colonoscopy have been reviewed with the patient.  Questions have been answered.  All parties agreeable. ? ? ?Jonathon Bellows, MD  11/16/2021, 8:00 AM ? ?

## 2021-11-19 ENCOUNTER — Encounter: Payer: Self-pay | Admitting: Gastroenterology

## 2021-11-19 LAB — SURGICAL PATHOLOGY

## 2021-11-21 ENCOUNTER — Encounter: Payer: Self-pay | Admitting: Gastroenterology

## 2021-11-23 ENCOUNTER — Ambulatory Visit
Admission: RE | Admit: 2021-11-23 | Discharge: 2021-11-23 | Disposition: A | Discharge status: Home / Self Care | Payer: BC Managed Care – PPO | Source: Ambulatory Visit | Attending: Physician Assistant | Admitting: Physician Assistant

## 2021-11-23 ENCOUNTER — Other Ambulatory Visit: Payer: Self-pay

## 2021-11-23 DIAGNOSIS — M948X8 Other specified disorders of cartilage, other site: Secondary | ICD-10-CM | POA: Diagnosis not present

## 2022-02-19 ENCOUNTER — Other Ambulatory Visit: Payer: Self-pay | Admitting: Family Medicine

## 2022-02-19 DIAGNOSIS — R918 Other nonspecific abnormal finding of lung field: Secondary | ICD-10-CM

## 2022-03-08 ENCOUNTER — Ambulatory Visit
Admission: RE | Admit: 2022-03-08 | Discharge: 2022-03-08 | Disposition: A | Discharge status: Home / Self Care | Payer: BC Managed Care – PPO | Source: Ambulatory Visit | Attending: Family Medicine | Admitting: Family Medicine

## 2022-03-08 DIAGNOSIS — R918 Other nonspecific abnormal finding of lung field: Secondary | ICD-10-CM | POA: Diagnosis present

## 2022-07-22 DIAGNOSIS — Z1331 Encounter for screening for depression: Secondary | ICD-10-CM | POA: Diagnosis not present

## 2022-07-22 DIAGNOSIS — Z1389 Encounter for screening for other disorder: Secondary | ICD-10-CM | POA: Diagnosis not present

## 2022-07-22 DIAGNOSIS — R918 Other nonspecific abnormal finding of lung field: Secondary | ICD-10-CM | POA: Diagnosis not present

## 2022-08-02 ENCOUNTER — Other Ambulatory Visit: Payer: Self-pay | Admitting: Nurse Practitioner

## 2022-08-02 DIAGNOSIS — R918 Other nonspecific abnormal finding of lung field: Secondary | ICD-10-CM

## 2023-05-13 ENCOUNTER — Other Ambulatory Visit: Payer: Self-pay | Admitting: Nurse Practitioner

## 2023-05-13 DIAGNOSIS — R918 Other nonspecific abnormal finding of lung field: Secondary | ICD-10-CM

## 2023-07-30 IMAGING — CT CT CHEST W/O CM
2 of 4 series · 14 of 36 positions shown, 17 images · non-contrast
Comparison: Chest radiograph 11/05/2021

CLINICAL DATA: Prominent xiphoid cartilage. Patient reports xiphoid
swelling for 6 months.



[Series 2: chest 2.00 · axial · 0.67mm/px · z∈[-1340,-1030]mm · 11 of 185 slices shown, 14 images]
[im 15/185  mediastinal]
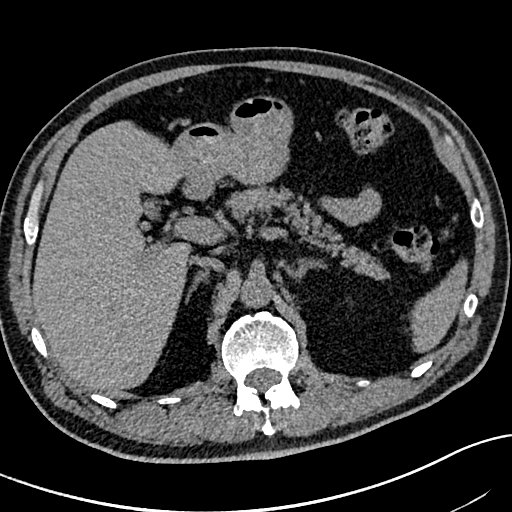
[im 15/185  lung]
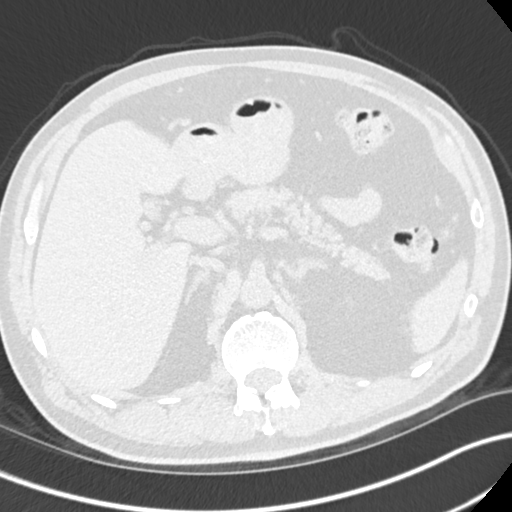
[im 29/185  lung]
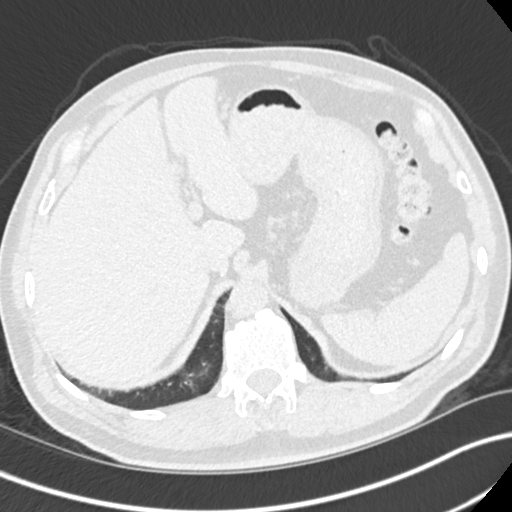
[im 43/185  lung]
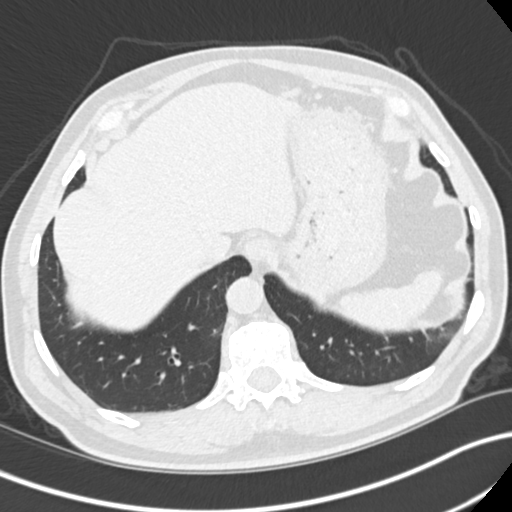
[im 57/185  lung]
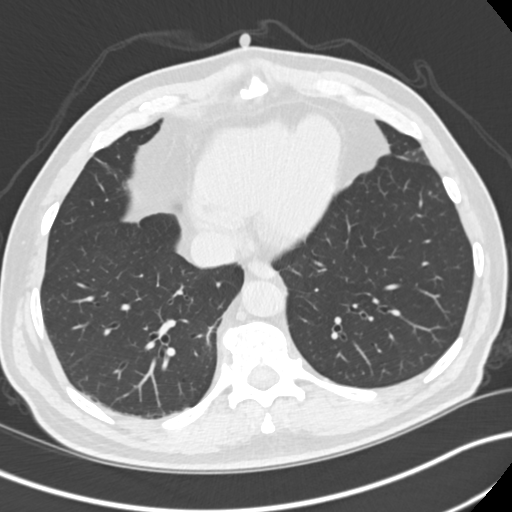
[im 71/185  mediastinal]
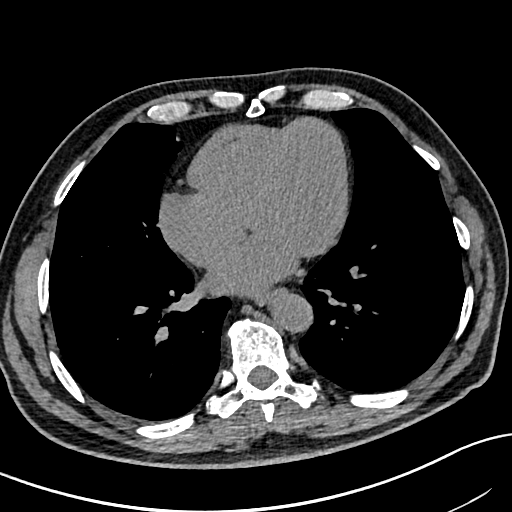
[im 71/185  lung]
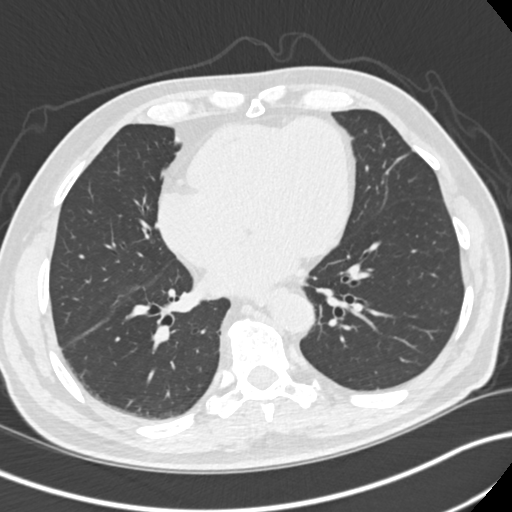
[im 100/185  lung]
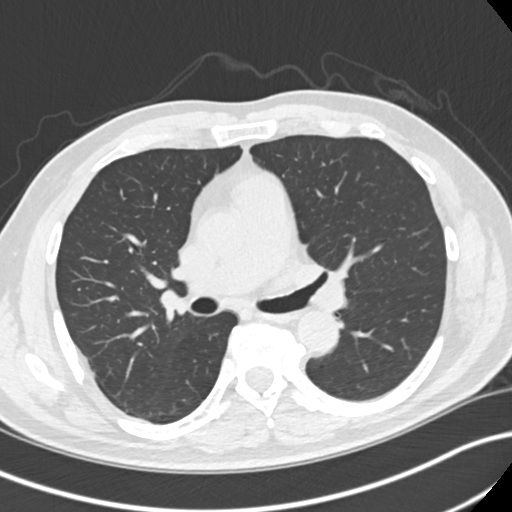
[im 114/185  lung]
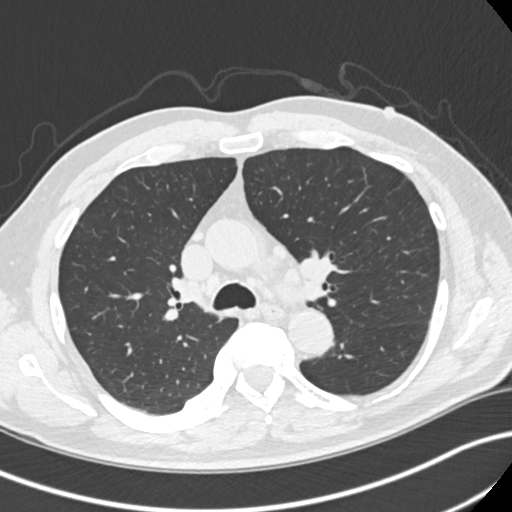
[im 128/185  lung]
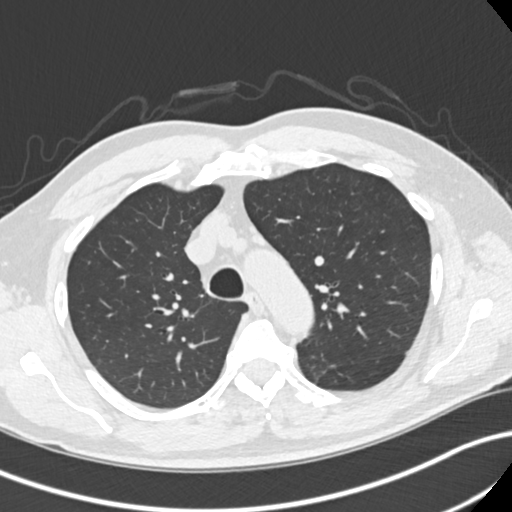
[im 142/185  mediastinal]
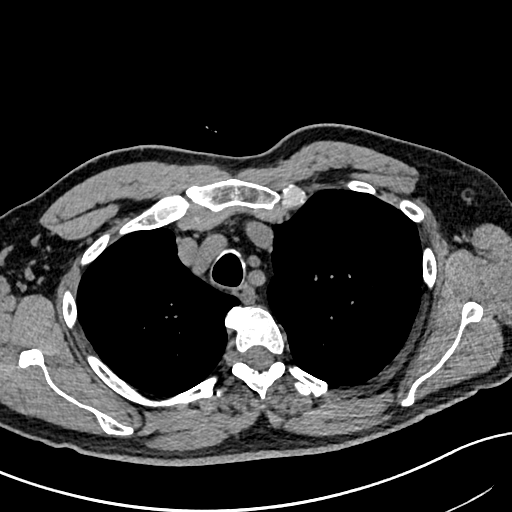
[im 142/185  lung]
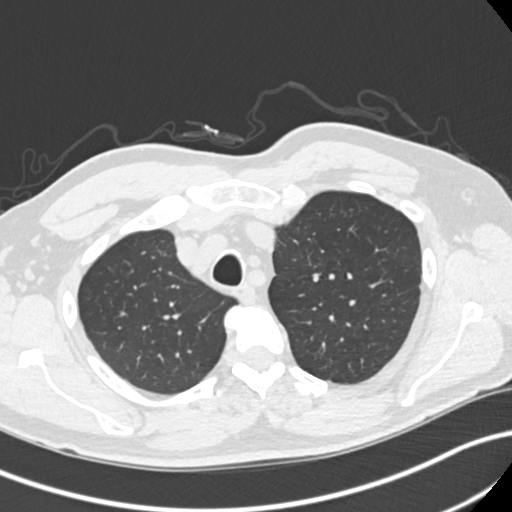
[im 156/185  lung]
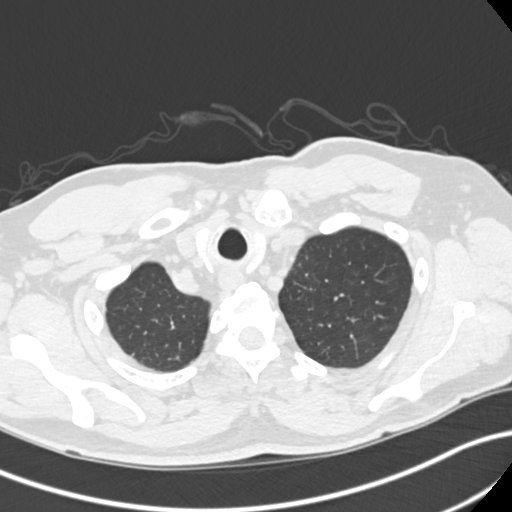
[im 170/185  lung]
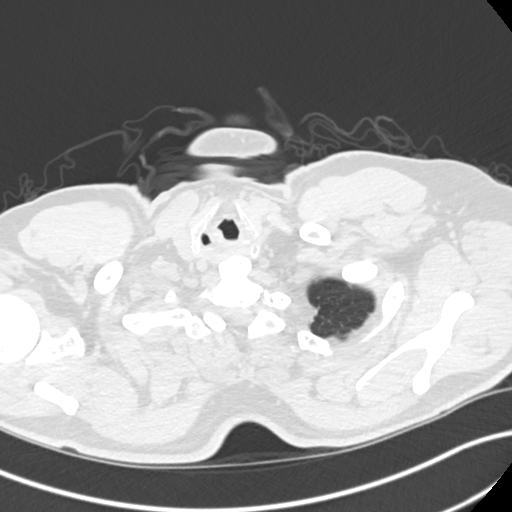

[Series 5: coronals chest 2.00 cor · coronal · 0.67mm/px · 3 of 146 slices shown]
[im 30/146  lung]
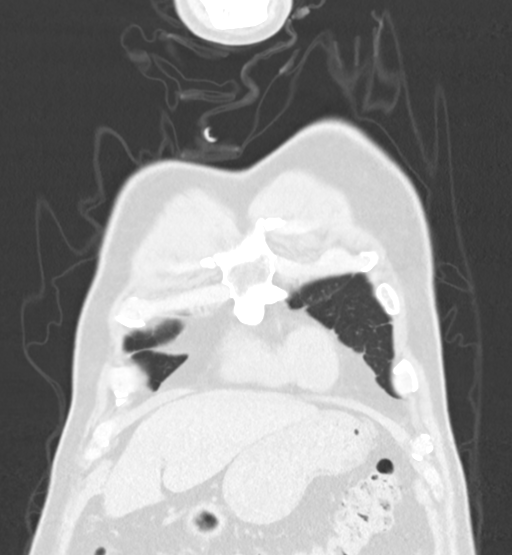
[im 59/146  lung]
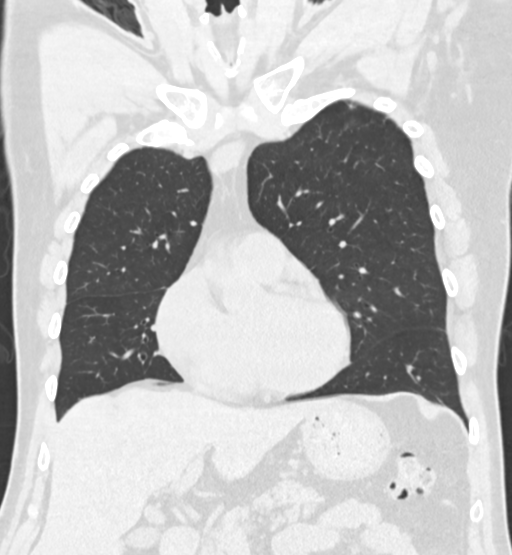
[im 88/146  lung]
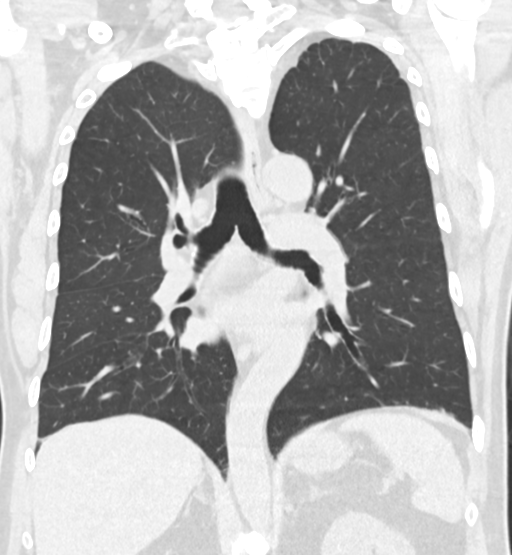

[14 of 36 positions shown; findings below may reference images not displayed]

FINDINGS: Cardiovascular: Elongated thoracic aorta without aneurysm. The heart
is normal in size. There is no pericardial effusion.

Mediastinum/Nodes: Increased number of small mediastinal lymph
nodes, none of which are greater than 10 mm short axis. There are
prominent bilateral axillary nodes, also not enlarged by size
criteria. Calcified right infrahilar node consistent with prior
granulomatous disease. Esophagus is decompressed. There are multiple
small paraesophageal lymph nodes. No thyroid nodule.

Lungs/Pleura: There are faint scattered tree-in-bud opacities
throughout both lungs with an upper lobe predominant distribution.
Right greater than left lower lobe bronchiectasis and
bronchiolectasis. Scattered areas of linear atelectasis within the
right lower and middle lobes as well as lingula. There are calcified
granuloma in the right lower lobe, benign. There are multiple faint
ground-glass nodules that are all less than 5 mm, for example series
3, images 122, image 76, and image 36. trachea and central bronchi
are patent. There is no pleural fluid.

Upper Abdomen: No acute or unexpected findings.

Musculoskeletal: The xiphoid process is curved anteriorly in its
distal portion. There is no discrete lytic or blastic osseous
lesion. No periosteal reaction or bony destruction. Radiographic
appearance likely represented overlying costochondral cartilage. No
overlying soft tissue abnormalities. Small areas of cortical
deficiency involving the sternal manubrium, for example series 7,
image 72, without definite focal bone lesion. There is a remote
anterior right fourth rib fracture with callus formation. Remote
posterolateral sixth rib fracture. Remote lateral left ninth rib.
Thoracic spondylosis with anterior spurring. Small cervical ribs.
IMPRESSION: 1. The xiphoid process is curved anteriorly in its distal portion.
There is no discrete lesion, bony abnormality or overlying soft
tissue abnormalities.
2. Small areas of cortical lucencies involving the sternal manubrium
is nonspecific. No discrete lytic lesion.
3. Faint scattered tree-in-bud opacities throughout both lungs with
an upper lobe predominant distribution. Right greater than left
lower lobe bronchiectasis and bronchiolectasis. Findings suggest
bronchiolitis, which may be infectious or inflammatory.
4. Multiple faint ground-glass nodules measuring less than 5 mm.
Recommend a non-contrast Chest CT at 3-6 months. If stable, consider
follow-up non-contrast Chest CTs at 2 and 4 years.
These guidelines do not apply to immunocompromised patients and
patients with cancer. Follow up in patients with significant
comorbidities as clinically warranted. For lung cancer screening,
adhere to Lung-RADS guidelines. Reference: Radiology. 0718;
284(1):228-43.
5. Increased number of small mediastinal and axillary lymph nodes
are likely reactive, none of which are enlarged by size criteria.
6. Remote bilateral rib fractures.

## 2024-01-05 ENCOUNTER — Other Ambulatory Visit: Payer: Self-pay | Admitting: Family Medicine

## 2024-01-05 DIAGNOSIS — R918 Other nonspecific abnormal finding of lung field: Secondary | ICD-10-CM

## 2024-01-09 ENCOUNTER — Ambulatory Visit
Admission: RE | Admit: 2024-01-09 | Discharge: 2024-01-09 | Disposition: A | Discharge status: Home / Self Care | Source: Ambulatory Visit | Attending: Family Medicine | Admitting: Family Medicine

## 2024-01-09 DIAGNOSIS — R918 Other nonspecific abnormal finding of lung field: Secondary | ICD-10-CM | POA: Insufficient documentation

## 2024-03-01 ENCOUNTER — Encounter: Payer: Self-pay | Admitting: Urology

## 2024-03-01 ENCOUNTER — Ambulatory Visit: Admitting: Urology

## 2024-03-01 VITALS — BP 115/69 | HR 79 | Ht 69.0 in | Wt 198.1 lb

## 2024-03-01 DIAGNOSIS — R972 Elevated prostate specific antigen [PSA]: Secondary | ICD-10-CM

## 2024-03-01 NOTE — Progress Notes (Signed)
 03/01/2024 2:20 PM   [name] [birth year] [id number]  Referring provider: [place], Sarah T, MD [address] [place],  [place] [address]  Chief Complaint  Patient presents with   Elevated PSA    HPI: Konstantin Lehnen is a 68 y.o. male referred for evaluation of an elevated PSA.  A [place] Spanish interpreter was present during this visit  PSA levels: 4.7 on 09/01/2023, 8.0 on 12/19/2023, 8.1 on 01/12/2024 Baseline PSA level: Not available Lower urinary tract symptoms: None Family history prostate cancer first-degree relatives: Negative Past urologic history: Negative  PMH: History reviewed. No pertinent past medical history.  Surgical History: Past Surgical History:  Procedure Laterality Date   COLONOSCOPY WITH PROPOFOL  N/A 11/16/2021   Procedure: COLONOSCOPY WITH PROPOFOL ;  Surgeon: Therisa Bi, MD;  Location: [facility] ENDOSCOPY;  Service: Gastroenterology;  Laterality: N/A;  SPANISH INTERPRETER   NO PAST SURGERIES      Home Medications:  Allergies as of 03/01/2024   No Known Allergies      Medication List        Accurate as of March 01, 2024  2:20 PM. If you have any questions, ask your nurse or doctor.          polyethylene glycol-electrolytes 420 g solution Commonly known as: NuLYTELY Prepare according to package instructions. Starting at 5:00 PM: Drink one 8 oz glass of mixture every 15 minutes until you finish half of the jug. Five hours prior to procedure, drink 8 oz glass of mixture every 15 minutes until it is all gone. Make sure you do not drink anything 4 hours prior to your procedure.        Allergies: No Known Allergies  Family History: History reviewed. No pertinent family history.  Social History:  reports that he has never smoked. He has never used smokeless tobacco. He reports that he does not drink alcohol and does not use drugs.   Physical Exam: BP 115/69 (BP Location: Left Arm, Patient Position: Sitting, Cuff  Size: Large)   Pulse 79   Ht 5' 9 (1.753 m)   Wt 198 lb 1.6 oz (89.9 kg)   BMI 29.25 kg/m   Constitutional:  Alert and oriented, No acute distress. HEENT: [place] AT Respiratory: Normal respiratory effort, no increased work of breathing. GU: Prostate 50 g, L >R however consistency is normal and uniform throughout.  No nodules or induration Psychiatric: Normal mood and affect.   Assessment & Plan:    1.  Elevated PSA Benign DRE Although PSA is a prostate cancer screening test he was informed that cancer is not the most common cause of an elevated PSA. Other potential causes including BPH and inflammation were discussed.  He was informed that the only way to adequately diagnose prostate cancer would be transrectal ultrasound and biopsy of the prostate. The procedure was discussed including potential risks of bleeding and infection/sepsis. He was also informed that a negative biopsy does not conclusively rule out the possibility that prostate cancer may be present and that continued monitoring is required.  The use of newer adjunctive blood and urine tests to predict the probability of high-grade prostate cancer were discussed. The use of multiparametric prostate MRI to evaluate for lesions suspicious for high grade prostate cancer and aid in targeted bx was reviewed.  Continued periodic surveillance was also discussed. Recommend further evaluation with prostate MRI.  Order was placed and patient will be contacted with the results    Glendia JAYSON Barba, MD  [facility]  Urology [facility] [address] [place], [place] [address] [phone]

## 2024-03-01 NOTE — Patient Instructions (Signed)
 Please contact Central Scheduling to set up your prostate MRI at [phone].  Prostate MRI Prep:  1- No ejaculation 48 hours prior to exam  2- No caffeine or carbonated beverages on day of the exam  3- Eat light diet evening prior and day of exam  4- Avoid eating 4 hours prior to exam  5- Fleets enema needs to be done 4 hours prior to exam -See below. Can be purchased at the drug store.

## 2024-03-09 ENCOUNTER — Ambulatory Visit: Admission: RE | Admit: 2024-03-09 | Source: Ambulatory Visit
